# Patient Record
Sex: Female | Born: 1960 | Race: White | Hispanic: No | Smoking: Current every day smoker
Health system: Southern US, Community
[De-identification: ages and names within clinical notes are randomized; demographics above are authoritative.]

## PROBLEM LIST (undated history)

## (undated) DIAGNOSIS — F419 Anxiety disorder, unspecified: Secondary | ICD-10-CM

## (undated) DIAGNOSIS — E7800 Pure hypercholesterolemia, unspecified: Secondary | ICD-10-CM

## (undated) DIAGNOSIS — R112 Nausea with vomiting, unspecified: Secondary | ICD-10-CM

## (undated) DIAGNOSIS — S2249XA Multiple fractures of ribs, unspecified side, initial encounter for closed fracture: Secondary | ICD-10-CM

## (undated) DIAGNOSIS — Q8901 Asplenia (congenital): Secondary | ICD-10-CM

## (undated) DIAGNOSIS — Z972 Presence of dental prosthetic device (complete) (partial): Secondary | ICD-10-CM

## (undated) DIAGNOSIS — M51369 Other intervertebral disc degeneration, lumbar region without mention of lumbar back pain or lower extremity pain: Secondary | ICD-10-CM

## (undated) DIAGNOSIS — Z9889 Other specified postprocedural states: Secondary | ICD-10-CM

## (undated) DIAGNOSIS — M1990 Unspecified osteoarthritis, unspecified site: Secondary | ICD-10-CM

## (undated) DIAGNOSIS — N3281 Overactive bladder: Secondary | ICD-10-CM

## (undated) DIAGNOSIS — E785 Hyperlipidemia, unspecified: Secondary | ICD-10-CM

## (undated) DIAGNOSIS — G4700 Insomnia, unspecified: Secondary | ICD-10-CM

## (undated) DIAGNOSIS — F32A Depression, unspecified: Secondary | ICD-10-CM

## (undated) DIAGNOSIS — K219 Gastro-esophageal reflux disease without esophagitis: Secondary | ICD-10-CM

## (undated) DIAGNOSIS — I639 Cerebral infarction, unspecified: Secondary | ICD-10-CM

## (undated) DIAGNOSIS — S2239XA Fracture of one rib, unspecified side, initial encounter for closed fracture: Secondary | ICD-10-CM

## (undated) HISTORY — PX: COLONOSCOPY: SHX174

## (undated) HISTORY — PX: ABDOMINAL HYSTERECTOMY: SHX81

## (undated) HISTORY — PX: OTHER SURGICAL HISTORY: SHX169

## (undated) HISTORY — PX: CHOLECYSTECTOMY: SHX55

## (undated) HISTORY — PX: ANKLE SURGERY: SHX546

## (undated) HISTORY — PX: TONSILLECTOMY: SUR1361

---

## 2004-03-29 ENCOUNTER — Ambulatory Visit: Payer: Self-pay | Admitting: General Practice

## 2006-09-25 ENCOUNTER — Ambulatory Visit: Payer: Self-pay | Admitting: Unknown Physician Specialty

## 2008-04-14 ENCOUNTER — Ambulatory Visit: Payer: Self-pay | Admitting: Internal Medicine

## 2008-07-22 ENCOUNTER — Ambulatory Visit: Payer: Self-pay | Admitting: Internal Medicine

## 2008-10-28 ENCOUNTER — Ambulatory Visit: Payer: Self-pay | Admitting: Otolaryngology

## 2008-11-03 ENCOUNTER — Ambulatory Visit: Payer: Self-pay | Admitting: Otolaryngology

## 2008-11-13 ENCOUNTER — Emergency Department: Payer: Self-pay | Admitting: Emergency Medicine

## 2008-11-18 ENCOUNTER — Ambulatory Visit: Payer: Self-pay | Admitting: Otolaryngology

## 2008-11-24 ENCOUNTER — Observation Stay: Payer: Self-pay | Admitting: Surgery

## 2008-11-24 ENCOUNTER — Emergency Department: Payer: Self-pay | Admitting: Emergency Medicine

## 2009-04-20 ENCOUNTER — Inpatient Hospital Stay: Payer: Self-pay | Admitting: General Practice

## 2009-07-25 ENCOUNTER — Emergency Department: Payer: Self-pay | Admitting: Emergency Medicine

## 2009-11-02 ENCOUNTER — Ambulatory Visit: Payer: Self-pay | Admitting: Otolaryngology

## 2010-01-04 ENCOUNTER — Ambulatory Visit: Payer: Self-pay | Admitting: Unknown Physician Specialty

## 2010-01-06 LAB — PATHOLOGY REPORT

## 2011-05-15 ENCOUNTER — Emergency Department: Payer: Self-pay | Admitting: *Deleted

## 2011-11-08 ENCOUNTER — Ambulatory Visit: Payer: Self-pay | Admitting: Family Medicine

## 2011-11-22 ENCOUNTER — Ambulatory Visit: Payer: Self-pay | Admitting: Family Medicine

## 2011-12-23 ENCOUNTER — Ambulatory Visit: Payer: Self-pay | Admitting: General Practice

## 2012-10-26 ENCOUNTER — Ambulatory Visit: Payer: Self-pay | Admitting: Orthopedic Surgery

## 2013-04-10 DIAGNOSIS — F411 Generalized anxiety disorder: Secondary | ICD-10-CM | POA: Insufficient documentation

## 2014-02-17 LAB — URINALYSIS, COMPLETE
Bacteria: NONE SEEN
Bilirubin,UR: NEGATIVE
Blood: NEGATIVE
Glucose,UR: NEGATIVE mg/dL (ref 0–75)
KETONE: NEGATIVE
Leukocyte Esterase: NEGATIVE
NITRITE: NEGATIVE
Ph: 7 (ref 4.5–8.0)
Protein: NEGATIVE
RBC,UR: NONE SEEN /HPF (ref 0–5)
SPECIFIC GRAVITY: 1.003 (ref 1.003–1.030)
Squamous Epithelial: <1
WBC UR: <1 /HPF (ref 0–5)

## 2014-02-17 LAB — CBC
HCT: 39.1 % (ref 35.0–47.0)
HGB: 13 g/dL (ref 12.0–16.0)
MCH: 32.4 pg (ref 26.0–34.0)
MCHC: 33.3 g/dL (ref 32.0–36.0)
MCV: 97 fL (ref 80–100)
Platelet: 236 10*3/uL (ref 150–440)
RBC: 4.02 10*6/uL (ref 3.80–5.20)
RDW: 13.8 % (ref 11.5–14.5)
WBC: 8.9 10*3/uL (ref 3.6–11.0)

## 2014-02-17 LAB — COMPREHENSIVE METABOLIC PANEL
Albumin: 3.6 g/dL (ref 3.4–5.0)
Alkaline Phosphatase: 67 U/L
Anion Gap: 1 — ABNORMAL LOW (ref 7–16)
BUN: 4 mg/dL — ABNORMAL LOW (ref 7–18)
Bilirubin,Total: 0.2 mg/dL (ref 0.2–1.0)
CALCIUM: 9.2 mg/dL (ref 8.5–10.1)
Chloride: 105 mmol/L (ref 98–107)
Co2: 32 mmol/L (ref 21–32)
Creatinine: 0.7 mg/dL (ref 0.60–1.30)
EGFR (African American): >60
Glucose: 93 mg/dL (ref 65–99)
OSMOLALITY: 272 (ref 275–301)
Potassium: 3.9 mmol/L (ref 3.5–5.1)
SGOT(AST): 12 U/L — ABNORMAL LOW (ref 15–37)
SGPT (ALT): 16 U/L
Sodium: 138 mmol/L (ref 136–145)
Total Protein: 6.6 g/dL (ref 6.4–8.2)

## 2014-02-17 LAB — CK TOTAL AND CKMB (NOT AT ARMC)
CK, TOTAL: 63 U/L
CK-MB: 0.5 ng/mL (ref 0.5–3.6)

## 2014-02-17 LAB — TROPONIN I

## 2014-02-17 LAB — LIPASE, BLOOD: Lipase: 85 U/L (ref 73–393)

## 2014-02-18 ENCOUNTER — Inpatient Hospital Stay: Payer: Self-pay | Admitting: Internal Medicine

## 2014-02-18 LAB — LIPID PANEL
Cholesterol: 199 mg/dL (ref 0–200)
HDL: 26 mg/dL — AB (ref 40–60)
LDL CHOLESTEROL, CALC: 109 mg/dL — AB (ref 0–100)
Triglycerides: 319 mg/dL — ABNORMAL HIGH (ref 0–200)
VLDL CHOLESTEROL, CALC: 64 mg/dL — AB (ref 5–40)

## 2014-02-26 DIAGNOSIS — E781 Pure hyperglyceridemia: Secondary | ICD-10-CM | POA: Insufficient documentation

## 2014-03-31 ENCOUNTER — Ambulatory Visit: Payer: Self-pay | Admitting: Family Medicine

## 2014-09-23 NOTE — Op Note (Signed)
PATIENT NAME:  Kelli, Marks MR#:  [id number] DATE OF BIRTH:  12-01-1960  DATE OF PROCEDURE:  12/23/2011  PREOPERATIVE DIAGNOSIS: Painful retained hardware status post ORIF of left bimalleolar ankle fracture.   POSTOPERATIVE DIAGNOSIS:  Painful retained hardware status post ORIF of left bimalleolar ankle fracture.   PROCEDURE PERFORMED: Removal of an eight-hole one-third tubular plate and seven screws from the left lateral malleolus.   SURGEON: Laurice Record. Hooten, MD   ANESTHESIA: General.   ESTIMATED BLOOD LOSS: Minimal.   TOURNIQUET TIME: 24 minutes.   DRAINS: None.   INDICATIONS FOR SURGERY:  The patient is a 54 year old female who is status post ORIF of a left bimalleolar ankle fracture. The fracture healed uneventfully. The patient has had some irritation across the region of the screw heads and the plate. Screw heads are visible under the skin and easily palpable due to the patient's body habitus. After discussion of the risks and benefits of surgical intervention, the patient expressed her understanding of the risks and benefits, and agreed with plans for surgical intervention.   PROCEDURE IN DETAIL: The patient was brought into the operating room and, after adequate general anesthesia was achieved, a tourniquet was placed on the patient's left thigh. The patient's left foot and ankle were cleaned and prepped with alcohol and DuraPrep and draped in the usual sterile fashion. A "time-out" was performed as per usual protocol. The left lower extremity was exsanguinated using an Esmarch, and the tourniquet was inflated to 300 mmHg. A lateral longitudinal incision was made in line with the previous surgical scar. The incision was taken down to the plate and screws with a full-thickness flap.  A total of six 3.5-mm cortical screws and one 4-mm cancellus screw was removed without difficulty. The eight-hole one-third tubular plate was elevated off of the distal fibula. The wound was irrigated  with copious amounts of normal saline with antibiotic solution. Good consolidation of the previous fracture was noted. The subcuticular tissue was reapproximated using interrupted sutures of #2-0 Vicryl. Skin was closed with skin staples. 10 mL of 0.25% Marcaine was injected along the incision site. A sterile dressing was applied followed by application of a soft bandage. Tourniquet was deflated after a total tourniquet time of 24 minutes.   The patient tolerated the procedure well. She was transported to the recovery room in stable condition.    ____________________________ Laurice Record. Holley Bouche., MD [id number]:bjt D: 12/23/2011 12:36:12 ET T: 12/23/2011 13:00:33 ET JOB#: [id number]  cc: Jeneen Rinks P. Holley Bouche., MD, <Dictator> JAMES P Holley Bouche MD ELECTRONICALLY SIGNED 12/25/2011 8:12

## 2014-09-27 ENCOUNTER — Emergency Department
Admit: 2014-09-27 | Disposition: A | Discharge status: Home / Self Care | Payer: Self-pay | Admitting: Emergency Medicine

## 2014-09-27 NOTE — H&P (Signed)
PATIENT NAME:  Kelli Marks, Kelli Marks MR#:  [id number] DATE OF BIRTH:  06/10/1960  DATE OF ADMISSION:  02/17/2014  REFERRING PHYSICIAN: Verdia Kuba. Kerman Passey, MD   PRIMARY CARE PHYSICIAN: Francee Nodal, MD  CHIEF COMPLAINT: Numbness.  HISTORY OF PRESENT ILLNESS: A 53 year old Caucasian female without significant past medical history presenting with bilateral lower extremity paresthesias as well as numbness. She describes 1 day duration of the above symptoms and bilateral paresthesias and numbness essentially from the knees downwards with associated lightheadedness. She describes lightheadedness when going from the sitting to standing position and denies any further symptomatology. She denies any fevers, chills, chest pain, palpitations and she has not had adequate p.o. intake the last few days.   REVIEW OF SYSTEMS: CONSTITUTIONAL: Denies fevers, chills, fatigue.  EYES: Denies blurred vision, double vision or eye pain.  EARS, NOSE, THROAT: Denies tinnitus, hearing loss. RESPIRATORY: Denies cough, wheeze, shortness of breath.  CARDIOVASCULAR: Denies chest pain, palpitations, edema.  GASTROINTESTINAL: Denies nausea, vomiting, diarrhea, abdominal pain.  GENITOURINARY: Denies dysuria or hematuria.  ENDOCRINOLOGY: Denies nocturia or thyroid problems. HEMATOLOGY: Denies easy bruising, bleeding.  SKIN: Denies rash or lesion. MUSCULOSKELETAL: Denies pain in neck, back, shoulder, knees, hips or arthritis symptoms. NEUROLOGICAL: Positive for paresthesias as well as numbness of the lower extremities as described above. Denies any paralysis. Denies any dysarthria or headache.  PSYCHIATRIC: Denies anxiety or depressive symptoms. Otherwise, full review of systems performed is negative.   PAST MEDICAL HISTORY: Depression, not otherwise specified.   SOCIAL HISTORY: Positive for tobacco use as well as occasional alcohol use. Denies any drug use.   FAMILY HISTORY: Positive for coronary artery disease.    ALLERGIES: PERCOCET.   HOME MEDICATIONS: Include Klonopin 0.5 mg p.o. 3 times daily as needed for anxiety, Effexor 75 mg 3 capsules p.o. daily, oxybutynin 5 mg p.o. b.i.d.   PHYSICAL EXAMINATION:  VITAL SIGNS: Heart rate 90, respirations 16, blood pressure 142/69, saturating 99% on room air. Weight 65.8 kg, BMI 22.7.  GENERAL: Well-nourished, well-developed, Caucasian female, currently in no acute distress.  HEAD: Normocephalic, atraumatic.  EYES: Pupils equal, round, reactive to light. Extraocular muscles intact. No scleral icterus.  MOUTH: Moist mucous membranes. Dentition poor. No abscess noted.  EARS, NOSE, THROAT: Clear without exudates. No external lesions.  NECK: Supple. No thyromegaly. No nodules. No JVD.  PULMONARY: Clear to auscultation bilaterally without wheezes, rales, rhonchi. No use of accessory muscles. Good respiratory effort.  CHEST: Nontender to palpation.  CARDIOVASCULAR: S1, S2, regular rate and rhythm. No murmurs, rubs or gallops. No edema. Pedal pulses 2+ bilaterally.  GASTROINTESTINAL: Soft, nontender, nondistended, no masses, positive bowel sounds. No hepatosplenomegaly. MUSCULOSKELETAL: No swelling, clubbing, or edema. Able to move all extremities.  NEUROLOGIC: Cranial nerves II-XII intact. No gross focal neurological deficits. Sensation intact. Reflexes intact.  SKIN: No ulceration, lesions, rash, or cyanosis Skin warm, dry, turgor intact. PSYCHIATRIC: Mood and affect within normal limits. Awake, oriented x 3. Insight and judgment intact.   LABORATORY DATA: Sodium 138, potassium 3.9, chloride 105, bicarbonate 32, BUN 4, creatinine 0.7, glucose 93. LFTs within normal limits. WBC 8.9, hemoglobin 13, platelets of 236,000. Urinalysis: No evidence of infection. CT head performed revealing oval area of low-density, subcortical white matter in the right frontal lobe possibly representing subacute infarct or ischemic change. CT of the cervical spine performed revealing  cervical spine degenerative changes, however, no acute findings.   ASSESSMENT AND PLAN: A 54 year old Caucasian female without significant past medical history presented with bilateral lower extremity paresthesias and numbness,  found to have right frontal cerebrovascular accident on CT head. 1.  Right frontal cerebrovascular accident. Initiate aspirin and statin therapy. Place on telemetry under observational status. Neurologic checks q. 4 hours. Transthoracic echocardiogram, carotid Dopplers as well as an MRI and lipid in searching for etiologies and risk factor modification.  2.  Depression, not otherwise specified. Continue with Effexor. 3.  Venous thromboembolism prophylaxis with SCDs.  CODE STATUS: The patient is a full code.  TIME SPENT: Forty-five minutes.   ____________________________ Aaron Mose. Donjuan Robison, MD dkh:TT D: 02/17/2014 22:07:07 ET T: 02/17/2014 22:38:18 ET JOB#: [id number]  cc: Aaron Mose. Jadis Mika, MD, <Dictator> Kasiah Manka Woodfin Ganja MD ELECTRONICALLY SIGNED 02/18/2014 20:27

## 2014-09-27 NOTE — Discharge Summary (Signed)
PATIENT NAME:  Kelli Marks, Kelli Marks MR#:  [id number] DATE OF BIRTH:  Dec 31, 1960  DATE OF ADMISSION:  02/18/2014 DATE OF DISCHARGE:  02/18/2014  For a detailed note, please take a look at the history and physical done on admission by Dr. Valentino Nose.   DISCHARGE DIAGNOSES AS FOLLOWS: Bilateral lower extremity paresthesias, etiology unclear, but cerebrovascular accident ruled out.  Anxiety. Hyperlipidemia. Urinary incontinence.   DIET:  The patient is being discharged in a low-sodium, low-fat diet.   ACTIVITY: As tolerated.   FOLLOWUP:  Dr. Mila Palmer in the next 1 to 2 weeks.   DISCHARGE MEDICATIONS:  Are oxybutynin 5 mg b.i.d., Klonopin 0.5 mg t.i.d. as needed, Effexor 75 mg 3 caps daily, aspirin 81 mg daily, and simvastatin 10 mg at bedtime.   PERTINENT STUDIES DONE DURING THE HOSPITAL COURSE: Are as follows: A CT scan of the head done without contrast on admission showing old area of low density in the subcortical white matter of the right frontal lobe. This could represent either a subacute infarct or area of chronic ischemic change. A CT of the cervical spine showing no evidence of any acute fracture or a bony abnormality.   An ultrasound of the carotids showing no evidence of any hemodynamically significant carotid artery stenosis. An MRI of the brain done without contrast showing no acute infarct, no intracranial hemorrhage, mild white matter changes consistent with small vessel disease, remote infarct in the right frontal operculum region. No intracranial masses. No hydrocephalus.   HOSPITAL COURSE: This is a 54 year old female who presents to the hospital with a headache and bilateral paresthesias.  1.  Bilateral lower extremity paresthesias and a headache. Initially, the patient's symptoms were thought to be related to a stroke, but that has now been ruled out. The patient underwent an extensive work-up including a CT head, MRI of the brain, carotid duplex, all of which are currently  negative for any acute abnormality. The patient still continues to have some paresthesias. This possibly could be related to some neuropathy or some underlying neurologic cause. She can be discharged home and then get an EMG study or see a neurologist as an outpatient in the next few weeks.  2.  Hyperlipidemia. The patient did have a lipid panel checked while in the hospital.  Her total cholesterol was close to 200.  Her LDL 109. I did discharge her on a low-dose statin.  3.  Anxiety. The patient was maintained on her Klonopin and she will resume that.  4.  Urinary incontinence. The patient was maintained on oxybutynin and she will also resume that upon discharge.   CODE STATUS:  The patient is a full code.   TIME SPENT ON DISCHARGE: Is 35 minutes   ____________________________ Belia Heman. Verdell Carmine, MD vjs:lr D: 02/18/2014 14:50:00 ET T: 02/18/2014 15:01:38 ET JOB#: [id number]  cc: Orion Crook, MD Belia Heman. Verdell Carmine, MD, <Dictator>    Henreitta Leber MD ELECTRONICALLY SIGNED 02/24/2014 9:43

## 2014-10-29 ENCOUNTER — Emergency Department
Admission: EM | Admit: 2014-10-29 | Discharge: 2014-10-29 | Disposition: A | Discharge status: Home / Self Care | Payer: 59 | Attending: Emergency Medicine | Admitting: Emergency Medicine

## 2014-10-29 ENCOUNTER — Emergency Department: Payer: 59

## 2014-10-29 ENCOUNTER — Encounter: Payer: Self-pay | Admitting: Urgent Care

## 2014-10-29 DIAGNOSIS — R110 Nausea: Secondary | ICD-10-CM | POA: Diagnosis not present

## 2014-10-29 DIAGNOSIS — R42 Dizziness and giddiness: Secondary | ICD-10-CM | POA: Insufficient documentation

## 2014-10-29 DIAGNOSIS — R0602 Shortness of breath: Secondary | ICD-10-CM | POA: Insufficient documentation

## 2014-10-29 DIAGNOSIS — Y9389 Activity, other specified: Secondary | ICD-10-CM | POA: Insufficient documentation

## 2014-10-29 DIAGNOSIS — Z7982 Long term (current) use of aspirin: Secondary | ICD-10-CM | POA: Diagnosis not present

## 2014-10-29 DIAGNOSIS — S2231XA Fracture of one rib, right side, initial encounter for closed fracture: Secondary | ICD-10-CM

## 2014-10-29 DIAGNOSIS — W19XXXA Unspecified fall, initial encounter: Secondary | ICD-10-CM

## 2014-10-29 DIAGNOSIS — Z79899 Other long term (current) drug therapy: Secondary | ICD-10-CM | POA: Insufficient documentation

## 2014-10-29 DIAGNOSIS — S2242XA Multiple fractures of ribs, left side, initial encounter for closed fracture: Secondary | ICD-10-CM | POA: Diagnosis not present

## 2014-10-29 DIAGNOSIS — Z720 Tobacco use: Secondary | ICD-10-CM | POA: Diagnosis not present

## 2014-10-29 DIAGNOSIS — Y9289 Other specified places as the place of occurrence of the external cause: Secondary | ICD-10-CM | POA: Insufficient documentation

## 2014-10-29 DIAGNOSIS — W06XXXA Fall from bed, initial encounter: Secondary | ICD-10-CM | POA: Diagnosis not present

## 2014-10-29 DIAGNOSIS — R05 Cough: Secondary | ICD-10-CM | POA: Diagnosis not present

## 2014-10-29 DIAGNOSIS — Y998 Other external cause status: Secondary | ICD-10-CM | POA: Diagnosis not present

## 2014-10-29 DIAGNOSIS — S299XXA Unspecified injury of thorax, initial encounter: Secondary | ICD-10-CM | POA: Diagnosis present

## 2014-10-29 HISTORY — DX: Pure hypercholesterolemia, unspecified: E78.00

## 2014-10-29 MED ORDER — OXYCODONE-ACETAMINOPHEN 5-325 MG PO TABS: 1.0000 | ORAL_TABLET | ORAL | Status: DC | PRN

## 2014-10-29 MED ORDER — KETOROLAC TROMETHAMINE 30 MG/ML IJ SOLN
30.0000 mg | Freq: Once | INTRAMUSCULAR | Status: AC
  Administered 2014-10-29: 30 mg via INTRAMUSCULAR

## 2014-10-29 MED ORDER — KETOROLAC TROMETHAMINE 30 MG/ML IJ SOLN
INTRAMUSCULAR | Status: AC
  Administered 2014-10-29: 30 mg via INTRAMUSCULAR
  Filled 2014-10-29: qty 1

## 2014-10-29 MED ORDER — ONDANSETRON HCL 4 MG PO TABS: 4.0000 mg | ORAL_TABLET | Freq: Every day | ORAL | Status: DC | PRN

## 2014-10-29 MED ORDER — MORPHINE SULFATE 4 MG/ML IJ SOLN
4.0000 mg | Freq: Once | INTRAMUSCULAR | Status: AC
  Administered 2014-10-29: 4 mg via INTRAMUSCULAR

## 2014-10-29 MED ORDER — MORPHINE SULFATE 4 MG/ML IJ SOLN
INTRAMUSCULAR | Status: AC
  Administered 2014-10-29: 4 mg via INTRAMUSCULAR
  Filled 2014-10-29: qty 1

## 2014-10-29 MED ORDER — ONDANSETRON 4 MG PO TBDP
4.0000 mg | ORAL_TABLET | Freq: Once | ORAL | Status: AC
  Administered 2014-10-29: 4 mg via ORAL

## 2014-10-29 MED ORDER — ONDANSETRON 4 MG PO TBDP
ORAL_TABLET | ORAL | Status: AC
  Administered 2014-10-29: 4 mg via ORAL
  Filled 2014-10-29: qty 1

## 2014-10-29 NOTE — ED Notes (Signed)
Pt informed to return if life threatening symptoms occur.   

## 2014-10-29 NOTE — Discharge Instructions (Signed)
Rib Fracture °A rib fracture is a break or crack in one of the bones of the ribs. The ribs are a group of long, curved bones that wrap around your chest and attach to your spine. They protect your lungs and other organs in the chest cavity. A broken or cracked rib is often painful, but most do not cause other problems. Most rib fractures heal on their own over time. However, rib fractures can be more serious if multiple ribs are broken or if broken ribs move out of place and push against other structures. °CAUSES  °· A direct blow to the chest. For example, this could happen during contact sports, a car accident, or a fall against a hard object. °· Repetitive movements with high force, such as pitching a baseball or having severe coughing spells. °SYMPTOMS  °· Pain when you breathe in or cough. °· Pain when someone presses on the injured area. °DIAGNOSIS  °Your caregiver will perform a physical exam. Various imaging tests may be ordered to confirm the diagnosis and to look for related injuries. These tests may include a chest X-ray, computed tomography (CT), magnetic resonance imaging (MRI), or a bone scan. °TREATMENT  °Rib fractures usually heal on their own in 1-3 months. The longer healing period is often associated with a continued cough or other aggravating activities. During the healing period, pain control is very important. Medication is usually given to control pain. Hospitalization or surgery may be needed for more severe injuries, such as those in which multiple ribs are broken or the ribs have moved out of place.  °HOME CARE INSTRUCTIONS  °· Avoid strenuous activity and any activities or movements that cause pain. Be careful during activities and avoid bumping the injured rib. °· Gradually increase activity as directed by your caregiver. °· Only take over-the-counter or prescription medications as directed by your caregiver. Do not take other medications without asking your caregiver first. °· Apply ice  to the injured area for the first 1-2 days after you have been treated or as directed by your caregiver. Applying ice helps to reduce inflammation and pain. °¨ Put ice in a plastic bag. °¨ Place a towel between your skin and the bag.   °¨ Leave the ice on for 15-20 minutes at a time, every 2 hours while you are awake. °· Perform deep breathing as directed by your caregiver. This will help prevent pneumonia, which is a common complication of a broken rib. Your caregiver may instruct you to: °¨ Take deep breaths several times a day. °¨ Try to cough several times a day, holding a pillow against the injured area. °¨ Use a device called an incentive spirometer to practice deep breathing several times a day. °· Drink enough fluids to keep your urine clear or pale yellow. This will help you avoid constipation.   °· Do not wear a rib belt or binder. These restrict breathing, which can lead to pneumonia.   °SEEK IMMEDIATE MEDICAL CARE IF:  °· You have a fever.   °· You have difficulty breathing or shortness of breath.   °· You develop a continual cough, or you cough up thick or bloody sputum. °· You feel sick to your stomach (nausea), throw up (vomit), or have abdominal pain.   °· You have worsening pain not controlled with medications.   °MAKE SURE YOU: °· Understand these instructions. °· Will watch your condition. °· Will get help right away if you are not doing well or get worse. °Document Released: 05/23/2005 Document Revised: 01/23/2013 Document Reviewed:   07/25/2012 [employer] Patient Information 2015 [place], [place]. This information is not intended to replace advice given to you by your health care provider. Make sure you discuss any questions you have with your health care provider.  Fall Prevention and Home Safety Falls cause injuries and can affect all age groups. It is possible to prevent falls.  HOW TO PREVENT FALLS  Wear shoes with rubber soles that do not have an opening for your toes.  Keep the inside  and outside of your house well lit.  Use night lights throughout your home.  Remove clutter from floors.  Clean up floor spills.  Remove throw rugs or fasten them to the floor with carpet tape.  Do not place electrical cords across pathways.  Put grab bars by your tub, shower, and toilet. Do not use towel bars as grab bars.  Put handrails on both sides of the stairway. Fix loose handrails.  Do not climb on stools or stepladders, if possible.  Do not wax your floors.  Repair uneven or unsafe sidewalks, walkways, or stairs.  Keep items you use a lot within reach.  Be aware of pets.  Keep emergency numbers next to the telephone.  Put smoke detectors in your home and near bedrooms. Ask your doctor what other things you can do to prevent falls. Document Released: 03/19/2009 Document Revised: 11/22/2011 Document Reviewed: 08/23/2011 [facility] Patient Information 2015 [place], [place]. This information is not intended to replace advice given to you by your health care provider. Make sure you discuss any questions you have with your health care provider.

## 2014-10-29 NOTE — ED Notes (Signed)
Patient presents with c/o LEFT lateral chest wall pain s/p fall from her bed. (+) SOB reported.

## 2014-10-29 NOTE — ED Notes (Signed)
Pt presents to ED after fall from bed between 330am and 4am this morning. Pt states she rolled out of bed onto the floor; pt denies any pain to extremities' pain is localized to ribs and neck.

## 2014-10-29 NOTE — ED Provider Notes (Signed)
[facility] Emergency Department Provider Note  ____________________________________________  Time seen: Approximately 563 AM  I have reviewed the triage vital signs and the nursing notes.   HISTORY  Chief Complaint Fall and Chest Pain   HPI GIANELLE MCCAUL is a 54 y.o. female who comes in tonight after rolling out of bed. The patient rolled approximately 3 feet off of her bed onto a hardwood floor. The patient reports that she landed on her back and felt as though she couldn't breathe initially. She denies hitting her head but has some 10 out of 10 left-sided chest pain. The patient reports that she feels as though she may have broken some ribs. The patient reports that she becomes nauseous and hurts when she moves.The patient came in immediately for further evaluation.   Past Medical History  Diagnosis Date  . Hypercholesteremia     There are no active problems to display for this patient.   Past Surgical History  Procedure Laterality Date  . Abdominal hysterectomy    . Ankle surgery Left   . Cholecystectomy    . Tonsillectomy      Current Outpatient Rx  Name  Route  Sig  Dispense  Refill  . aspirin EC 81 MG tablet   Oral   Take 81 mg by mouth daily.         Marland Kitchen oxybutynin (DITROPAN) 5 MG tablet   Oral   Take 5 mg by mouth 2 (two) times daily.         . simvastatin (ZOCOR) 10 MG tablet   Oral   Take 10 mg by mouth at bedtime.          Marland Kitchen venlafaxine XR (EFFEXOR-XR) 150 MG 24 hr capsule   Oral   Take 150 mg by mouth daily.          Marland Kitchen aspirin 81 MG tablet   Oral   Take 81 mg by mouth daily.         Marland Kitchen oxyCODONE-acetaminophen (ROXICET) 5-325 MG per tablet   Oral   Take 1 tablet by mouth every 4 (four) hours as needed for severe pain.   20 tablet   0     Allergies Review of patient's allergies indicates no known allergies.  No family history on file.  Social History History  Substance Use Topics  . Smoking status:  Current Every Day Smoker  . Smokeless tobacco: Not on file  . Alcohol Use: No    Review of Systems Constitutional: No fever/chills Eyes: No visual changes. ENT: No sore throat. Cardiovascular:  chest pain. Respiratory:  shortness of breath and cough Gastrointestinal: Nausea Genitourinary: Negative for dysuria. Musculoskeletal:  back pain. Skin: Negative for rash. Neurological: Lightheadedness  10-point ROS otherwise negative.  ____________________________________________   PHYSICAL EXAM:  VITAL SIGNS: ED Triage Vitals  Enc Vitals Group     BP 10/29/14 0517 111/68 mmHg     Pulse Rate 10/29/14 0517 80     Resp 10/29/14 0517 18     Temp 10/29/14 0517 98.9 F (37.2 C)     Temp Source 10/29/14 0517 Oral     SpO2 10/29/14 0517 99 %     Weight 10/29/14 0517 145 lb (65.772 kg)     Height 10/29/14 0517 5\' 7"  (1.702 m)     Head Cir --      Peak Flow --      Pain Score 10/29/14 0518 10     Pain Loc --  Pain Edu? --      Excl. in [place]? --     Constitutional: Alert and oriented. Well appearing and in moderate to severe distress. Eyes: Conjunctivae are normal. PERRL. EOMI. Head: Atraumatic. Nose: No congestion/rhinnorhea. Mouth/Throat: Mucous membranes are moist.  Oropharynx non-erythematous. Cardiovascular: Normal rate, regular rhythm. Grossly normal heart sounds.  Good peripheral circulation. Respiratory: Normal respiratory effort.  No retractions. Lungs CTAB left chest wall tenderness to palpation Gastrointestinal: Soft and nontender. No distention. Positive bowel sounds Genitourinary: Deferred Musculoskeletal: No lower extremity tenderness nor edema.   Neurologic:  Normal speech and language. No gross focal neurologic deficits are appreciated.  Skin:  Skin is warm, dry and intact. No rash noted. Psychiatric: Mood and affect are normal. Speech and behavior are normal.  ____________________________________________   LABS (all labs ordered are listed, but only  abnormal results are displayed)  Labs Reviewed - No data to display ____________________________________________  EKG  None ____________________________________________  RADIOLOGY  Chest x-ray: Nondisplaced left posterior seventh and eighth rib fractures, no evidence of intrathoracic injury ____________________________________________   PROCEDURES  Procedure(s) performed: None  Critical Care performed: No  ____________________________________________   INITIAL IMPRESSION / ASSESSMENT AND PLAN / ED COURSE  Pertinent labs & imaging results that were available during my care of the patient were reviewed by me and considered in my medical decision making (see chart for details).  The patient is a 54 year old female who rolled out of bed and developed some severe left-sided chest pain after she fell onto the hardwood floors. The patient appears to have some left rib fractures posteriorly causing her pain. I will give the patient a dose of morphine IM Zofran orally as well as Toradol. I will reassess the patient when she's receive her medications.  The patient reports that her pain is improved although it still bad with moving. I will discharge the patient home with an incentive spirometer to help her maintain her deep inspirations. I will also have the patient follow-up with her primary care physician. ____________________________________________   FINAL CLINICAL IMPRESSION(S) / ED DIAGNOSES  Final diagnoses:  Closed rib fracture, right, initial encounter  Fall, initial encounter      Loney Hering, MD 10/29/14 [phone]

## 2014-10-29 NOTE — ED Notes (Signed)
Pt able to use incentive spirometry. Pt education given at this time.Pt with satisfactory example.

## 2014-10-31 ENCOUNTER — Emergency Department: Payer: 59

## 2014-10-31 ENCOUNTER — Encounter: Payer: Self-pay | Admitting: Radiology

## 2014-10-31 ENCOUNTER — Inpatient Hospital Stay
Admission: EM | Admit: 2014-10-31 | Discharge: 2014-11-09 | DRG: 799 | Disposition: A | Discharge status: Home / Self Care | Payer: 59 | Attending: Surgery | Admitting: Surgery

## 2014-10-31 ENCOUNTER — Inpatient Hospital Stay: Payer: 59

## 2014-10-31 DIAGNOSIS — D62 Acute posthemorrhagic anemia: Secondary | ICD-10-CM | POA: Diagnosis present

## 2014-10-31 DIAGNOSIS — J9600 Acute respiratory failure, unspecified whether with hypoxia or hypercapnia: Secondary | ICD-10-CM | POA: Diagnosis not present

## 2014-10-31 DIAGNOSIS — E871 Hypo-osmolality and hyponatremia: Secondary | ICD-10-CM | POA: Diagnosis present

## 2014-10-31 DIAGNOSIS — J9601 Acute respiratory failure with hypoxia: Secondary | ICD-10-CM | POA: Diagnosis present

## 2014-10-31 DIAGNOSIS — J9811 Atelectasis: Secondary | ICD-10-CM | POA: Diagnosis present

## 2014-10-31 DIAGNOSIS — G8929 Other chronic pain: Secondary | ICD-10-CM | POA: Diagnosis present

## 2014-10-31 DIAGNOSIS — F329 Major depressive disorder, single episode, unspecified: Secondary | ICD-10-CM | POA: Diagnosis present

## 2014-10-31 DIAGNOSIS — D72829 Elevated white blood cell count, unspecified: Secondary | ICD-10-CM | POA: Diagnosis present

## 2014-10-31 DIAGNOSIS — E780 Pure hypercholesterolemia: Secondary | ICD-10-CM | POA: Diagnosis present

## 2014-10-31 DIAGNOSIS — Z7982 Long term (current) use of aspirin: Secondary | ICD-10-CM

## 2014-10-31 DIAGNOSIS — E785 Hyperlipidemia, unspecified: Secondary | ICD-10-CM | POA: Diagnosis present

## 2014-10-31 DIAGNOSIS — R32 Unspecified urinary incontinence: Secondary | ICD-10-CM | POA: Diagnosis present

## 2014-10-31 DIAGNOSIS — K5900 Constipation, unspecified: Secondary | ICD-10-CM | POA: Diagnosis present

## 2014-10-31 DIAGNOSIS — S2242XA Multiple fractures of ribs, left side, initial encounter for closed fracture: Secondary | ICD-10-CM | POA: Diagnosis present

## 2014-10-31 DIAGNOSIS — J449 Chronic obstructive pulmonary disease, unspecified: Secondary | ICD-10-CM | POA: Diagnosis present

## 2014-10-31 DIAGNOSIS — J9690 Respiratory failure, unspecified, unspecified whether with hypoxia or hypercapnia: Secondary | ICD-10-CM

## 2014-10-31 DIAGNOSIS — W06XXXA Fall from bed, initial encounter: Secondary | ICD-10-CM | POA: Diagnosis present

## 2014-10-31 DIAGNOSIS — E876 Hypokalemia: Secondary | ICD-10-CM | POA: Diagnosis not present

## 2014-10-31 DIAGNOSIS — Z79899 Other long term (current) drug therapy: Secondary | ICD-10-CM

## 2014-10-31 DIAGNOSIS — E860 Dehydration: Secondary | ICD-10-CM | POA: Diagnosis present

## 2014-10-31 DIAGNOSIS — D649 Anemia, unspecified: Secondary | ICD-10-CM | POA: Diagnosis present

## 2014-10-31 DIAGNOSIS — Y92013 Bedroom of single-family (private) house as the place of occurrence of the external cause: Secondary | ICD-10-CM

## 2014-10-31 DIAGNOSIS — K661 Hemoperitoneum: Secondary | ICD-10-CM | POA: Diagnosis present

## 2014-10-31 DIAGNOSIS — S36039A Unspecified laceration of spleen, initial encounter: Secondary | ICD-10-CM | POA: Diagnosis present

## 2014-10-31 DIAGNOSIS — Z4659 Encounter for fitting and adjustment of other gastrointestinal appliance and device: Secondary | ICD-10-CM

## 2014-10-31 DIAGNOSIS — F17210 Nicotine dependence, cigarettes, uncomplicated: Secondary | ICD-10-CM | POA: Diagnosis present

## 2014-10-31 HISTORY — DX: Fracture of one rib, unspecified side, initial encounter for closed fracture: S22.39XA

## 2014-10-31 HISTORY — DX: Hyperlipidemia, unspecified: E78.5

## 2014-10-31 HISTORY — DX: Multiple fractures of ribs, unspecified side, initial encounter for closed fracture: S22.49XA

## 2014-10-31 LAB — CBC WITH DIFFERENTIAL/PLATELET
Basophils Absolute: 0.1 10*3/uL (ref 0–0.1)
Basophils Relative: 1 %
EOS ABS: 0 10*3/uL (ref 0–0.7)
Eosinophils Relative: 0 %
HEMATOCRIT: 21.9 % — AB (ref 35.0–47.0)
Hemoglobin: 7.5 g/dL — ABNORMAL LOW (ref 12.0–16.0)
LYMPHS ABS: 1.6 10*3/uL (ref 1.0–3.6)
Lymphocytes Relative: 12 %
MCH: 31.9 pg (ref 26.0–34.0)
MCHC: 34.5 g/dL (ref 32.0–36.0)
MCV: 92.5 fL (ref 80.0–100.0)
Monocytes Absolute: 1.2 10*3/uL — ABNORMAL HIGH (ref 0.2–0.9)
Monocytes Relative: 9 %
Neutro Abs: 10.6 10*3/uL — ABNORMAL HIGH (ref 1.4–6.5)
Neutrophils Relative %: 78 %
Platelets: 189 10*3/uL (ref 150–440)
RBC: 2.36 MIL/uL — AB (ref 3.80–5.20)
RDW: 13.3 % (ref 11.5–14.5)
WBC: 13.5 10*3/uL — ABNORMAL HIGH (ref 3.6–11.0)

## 2014-10-31 LAB — GLUCOSE, CAPILLARY: Glucose-Capillary: 140 mg/dL — ABNORMAL HIGH (ref 65–99)

## 2014-10-31 LAB — COMPREHENSIVE METABOLIC PANEL
ALBUMIN: 3.7 g/dL (ref 3.5–5.0)
ALK PHOS: 45 U/L (ref 38–126)
ALT: 12 U/L — AB (ref 14–54)
ANION GAP: 8 (ref 5–15)
AST: 19 U/L (ref 15–41)
BUN: 22 mg/dL — ABNORMAL HIGH (ref 6–20)
CO2: 27 mmol/L (ref 22–32)
Calcium: 8.6 mg/dL — ABNORMAL LOW (ref 8.9–10.3)
Chloride: 93 mmol/L — ABNORMAL LOW (ref 101–111)
Creatinine, Ser: 0.9 mg/dL (ref 0.44–1.00)
GFR calc Af Amer: >60 mL/min (ref >60)
GFR calc non Af Amer: >60 mL/min (ref >60)
Glucose, Bld: 113 mg/dL — ABNORMAL HIGH (ref 65–99)
Potassium: 4 mmol/L (ref 3.5–5.1)
Sodium: 128 mmol/L — ABNORMAL LOW (ref 135–145)
Total Bilirubin: 0.2 mg/dL — ABNORMAL LOW (ref 0.3–1.2)
Total Protein: 6.3 g/dL — ABNORMAL LOW (ref 6.5–8.1)

## 2014-10-31 LAB — ABO/RH: ABO/RH(D): O NEG

## 2014-10-31 LAB — TROPONIN I: Troponin I: <0.03 ng/mL (ref <0.031)

## 2014-10-31 MED ORDER — SODIUM CHLORIDE 0.9 % IV BOLUS (SEPSIS)
1000.0000 mL | Freq: Once | INTRAVENOUS | Status: AC
  Administered 2014-10-31: 1000 mL via INTRAVENOUS

## 2014-10-31 MED ORDER — IPRATROPIUM-ALBUTEROL 0.5-2.5 (3) MG/3ML IN SOLN
3.0000 mL | RESPIRATORY_TRACT | Status: DC
  Administered 2014-10-31 – 2014-11-01 (×7): 3 mL via RESPIRATORY_TRACT
  Filled 2014-10-31 (×7): qty 3

## 2014-10-31 MED ORDER — NICOTINE 14 MG/24HR TD PT24
14.0000 mg | MEDICATED_PATCH | Freq: Every day | TRANSDERMAL | Status: DC
  Administered 2014-10-31 – 2014-11-03 (×4): 14 mg via TRANSDERMAL
  Filled 2014-10-31 (×5): qty 1

## 2014-10-31 MED ORDER — HYDROMORPHONE HCL 1 MG/ML IJ SOLN
INTRAMUSCULAR | Status: AC
  Administered 2014-10-31: 1 mg via INTRAVENOUS
  Filled 2014-10-31: qty 1

## 2014-10-31 MED ORDER — IPRATROPIUM-ALBUTEROL 0.5-2.5 (3) MG/3ML IN SOLN
RESPIRATORY_TRACT | Status: AC
  Administered 2014-10-31: 3 mL via RESPIRATORY_TRACT
  Filled 2014-10-31: qty 3

## 2014-10-31 MED ORDER — ONDANSETRON HCL 4 MG/2ML IJ SOLN
INTRAMUSCULAR | Status: AC
  Administered 2014-10-31: 4 mg via INTRAVENOUS
  Filled 2014-10-31: qty 2

## 2014-10-31 MED ORDER — SODIUM CHLORIDE 0.9 % IV SOLN
10.0000 mL/h | Freq: Once | INTRAVENOUS | Status: AC
  Administered 2014-10-31: 10 mL/h via INTRAVENOUS

## 2014-10-31 MED ORDER — IOHEXOL 300 MG/ML  SOLN
75.0000 mL | Freq: Once | INTRAMUSCULAR | Status: AC | PRN
  Administered 2014-10-31: 75 mL via INTRAVENOUS

## 2014-10-31 MED ORDER — HYDROMORPHONE HCL 1 MG/ML IJ SOLN
0.5000 mg | INTRAMUSCULAR | Status: DC | PRN
  Administered 2014-10-31: 0.5 mg via INTRAVENOUS
  Filled 2014-10-31: qty 1

## 2014-10-31 MED ORDER — IOHEXOL 300 MG/ML  SOLN
75.0000 mL | Freq: Once | INTRAMUSCULAR | Status: AC | PRN
Start: 2014-10-31 — End: 2014-10-31
  Administered 2014-10-31: 75 mL via INTRAVENOUS

## 2014-10-31 MED ORDER — IPRATROPIUM-ALBUTEROL 0.5-2.5 (3) MG/3ML IN SOLN
3.0000 mL | Freq: Once | RESPIRATORY_TRACT | Status: AC
  Administered 2014-10-31: 3 mL via RESPIRATORY_TRACT

## 2014-10-31 MED ORDER — ONDANSETRON HCL 4 MG/2ML IJ SOLN
4.0000 mg | Freq: Once | INTRAMUSCULAR | Status: AC
  Administered 2014-10-31: 4 mg via INTRAVENOUS

## 2014-10-31 MED ORDER — HYDROMORPHONE HCL 1 MG/ML IJ SOLN
1.0000 mg | Freq: Once | INTRAMUSCULAR | Status: AC
  Administered 2014-10-31: 1 mg via INTRAVENOUS

## 2014-10-31 MED ORDER — METHYLPREDNISOLONE SODIUM SUCC 125 MG IJ SOLR
80.0000 mg | Freq: Once | INTRAMUSCULAR | Status: AC
  Administered 2014-10-31: 80 mg via INTRAVENOUS

## 2014-10-31 MED ORDER — METHYLPREDNISOLONE SODIUM SUCC 125 MG IJ SOLR
INTRAMUSCULAR | Status: AC
Start: 2014-10-31 — End: 2014-10-31
  Administered 2014-10-31: 80 mg via INTRAVENOUS
  Filled 2014-10-31: qty 2

## 2014-10-31 MED ORDER — SODIUM CHLORIDE 0.9 % IV SOLN
INTRAVENOUS | Status: DC
  Administered 2014-10-31: 19:00:00 via INTRAVENOUS

## 2014-10-31 NOTE — ED Notes (Signed)
MD at bedside. 

## 2014-10-31 NOTE — Consult Note (Signed)
[facility] [place] NAME: Kelli Marks    MR#:  [id number]  DATE OF BIRTH:  [birth year]  DATE OF ADMISSION:  10/31/2014  PRIMARY CARE PHYSICIAN: No primary care provider on file.   REQUESTING/REFERRING PHYSICIAN: Dr. Thomasene Lot  CHIEF COMPLAINT:   Chief Complaint  Patient presents with  . Rib Injury  . Fall  Shortness of breath  HISTORY OF PRESENT ILLNESS:  Kelli Marks  is a 54 y.o. female with a known history of hyperlipidemia and tobacco abuse. The patient rolled out of bed earlier this week and sustained rib fracturesof the left seventh and eighth rib. She she was sent back home. She continuously has a left side rib pain and started to have shortness breath and weakness since yesterday. His O2 saturation decreased to 70s to 80s even she was put on oxygen 4 L,  and she is now on nonrebreather 100% OXYGEN, but does still feel shortness of breath. The patient CAT scan of chest and abdomen show splenic laceration and rib fractures. Dr. Felton Clinton, the surgeon will see the patient. Hospitalist was requested to do consult for respiratory failure and possible COPD. Past Medical History  Diagnosis Date  . Hypercholesteremia   . Hyperlipidemia     PAST SURGICAL HISTOIRY:   Past Surgical History  Procedure Laterality Date  . Abdominal hysterectomy    . Ankle surgery Left   . Cholecystectomy    . Tonsillectomy      SOCIAL HISTORY:   History  Substance Use Topics  . Smoking status: Current Every Day Smoker -- 1.00 packs/day for 30 years  . Smokeless tobacco: Current User  . Alcohol Use: No    FAMILY HISTORY:   Family History  Problem Relation Age of Onset  . Rectal cancer Mother   . Lung cancer Father     DRUG ALLERGIES:  No Known Allergies  REVIEW OF SYSTEMS:  CONSTITUTIONAL:  no fever or chills. Generalizedweakness.  EYES: No blurred or double vision.  EARS, NOSE, AND THROAT: No tinnitus or ear pain.  RESPIRATORY:  Positive for cough and shortness of breath, no  wheezing or hemoptysis.  CARDIOVASCULAR:  left side chest pain,  no orthopnea, edema.  GASTROINTESTINAL: No nausea, vomiting, diarrhea or abdominal pain.  GENITOURINARY: No dysuria, hematuria.  ENDOCRINE: No polyuria, nocturia,  HEMATOLOGY: No anemia, easy bruising or bleeding SKIN: No rash or lesion. MUSCULOSKELETAL: No joint pain or arthritis.   NEUROLOGIC: No tingling, numbness, weakness.  PSYCHIATRY: No anxiety or depression.   MEDICATIONS AT HOME:   Prior to Admission medications   Medication Sig Start Date End Date Taking? Authorizing Provider  aspirin EC 81 MG tablet Take 81 mg by mouth daily.   Yes Historical Provider, MD  ondansetron (ZOFRAN) 4 MG tablet Take 1 tablet (4 mg total) by mouth daily as needed for nausea or vomiting. 10/29/14  Yes Earleen Newport, MD  oxybutynin (DITROPAN) 5 MG tablet Take 5 mg by mouth 2 (two) times daily.   Yes Historical Provider, MD  oxyCODONE-acetaminophen (ROXICET) 5-325 MG per tablet Take 1 tablet by mouth every 4 (four) hours as needed for severe pain. 10/29/14  Yes Loney Hering, MD  simvastatin (ZOCOR) 10 MG tablet Take 10 mg by mouth at bedtime.    Yes Historical Provider, MD  venlafaxine XR (EFFEXOR-XR) 150 MG 24 hr capsule Take 150 mg by mouth daily.    Yes Historical Provider, MD      VITAL SIGNS:  Blood pressure 133/72, pulse 111, temperature 98.4 F (36.9 C), temperature source Oral, resp. rate 26, height 5\' 7"  (1.702 m), weight 63.504 kg (140 lb), SpO2 99 %.  PHYSICAL EXAMINATION:  GENERAL:  54 y.o.-year-old patient lying in the bed with no acute distress.  EYES: Pupils equal, round, reactive to light and accommodation. No scleral icterus. Extraocular muscles intact.  HEENT: Head atraumatic, normocephalic. Oropharynx and nasopharynx clear.  NECK:  Supple, no jugular venous distention. No thyroid enlargement, no tenderness.  LUNGS:very diminished breath sounds bilaterally, no  wheezing, rales,rhonchi or crepitation. No use of accessory muscles of respiration.  left chest wall tenderness.  CARDIOVASCULAR: S1, S2 normal. No murmurs, rubs, or gallops.  ABDOMEN: Soft, nontender, nondistended. Bowel sounds present. No organomegaly or mass.  EXTREMITIES: No pedal edema, cyanosis, or clubbing.  NEUROLOGIC: Cranial nerves II through XII are intact. Muscle strength 5/5 in all extremities. Sensation intact. Gait not checked.  PSYCHIATRIC: The patient is alert and oriented x 3.  SKIN: No obvious rash, lesion, or ulcer.   LABORATORY PANEL:   CBC  Recent Labs Lab 10/31/14 1358  WBC 13.5*  HGB 7.5*  HCT 21.9*  PLT 189   ------------------------------------------------------------------------------------------------------------------  Chemistries   Recent Labs Lab 10/31/14 1358  NA 128*  K 4.0  CL 93*  CO2 27  GLUCOSE 113*  BUN 22*  CREATININE 0.90  CALCIUM 8.6*  AST 19  ALT 12*  ALKPHOS 45  BILITOT 0.2*   ------------------------------------------------------------------------------------------------------------------  Cardiac Enzymes  Recent Labs Lab 10/31/14 1358  TROPONINI <0.03   ------------------------------------------------------------------------------------------------------------------  RADIOLOGY:  Ct Chest W Contrast  10/31/2014   CLINICAL DATA:  Left-sided chest pain and shortness of breath after fall out of bed 2 days ago.  EXAM: CT CHEST WITH CONTRAST  TECHNIQUE: Multidetector CT imaging of the chest was performed during intravenous contrast administration.  CONTRAST:  98mL OMNIPAQUE IOHEXOL 300 MG/ML  SOLN  COMPARISON:  Chest radiograph of same day.  FINDINGS: No pneumothorax is noted. Mild emphysematous changes noted in the upper lobes bilaterally. Thoracic aorta appears normal. Visualized portions of pulmonary arteries appear normal. No mediastinal mass or adenopathy is noted. Mild bilateral posterior basilar atelectasis is noted  with minimal associated pleural effusions. Fluid or mucous plugging is noted within the right lower lobe bronchi.  Nondisplaced fracture is seen involving the posterior portion of the left sixth rib. Mildly displaced fracture seen involving posterior portion of the left seventh rib. Moderately displaced fracture of the posterior portion of left eighth rib is noted.  Large hematoma is seen surrounding the spleen consistent with splenic laceration.  IMPRESSION: There is noted fluid or mucous plugging within the right lower lobe bronchi.  Multiple fractures are seen involving the posterior portions of the left ribs.  Mild bilateral posterior basilar atelectasis is noted with minimal associated pleural effusions.  Large hematoma is seen surrounding the spleen consistent with splenic laceration.  Critical Value/emergent results were called by telephone at the time of interpretation on 10/31/2014 at 4:27 pm to Dr. Francene Castle , who verbally acknowledged these results.   Electronically Signed   By: Marijo Conception, M.D.   On: 10/31/2014 16:30   Dg Chest Portable 1 View  10/31/2014   CLINICAL DATA:  Fall.  EXAM: PORTABLE CHEST - 1 VIEW  COMPARISON:  10/29/2014  FINDINGS: Normal heart size. Small bilateral pleural effusions are suspected. Atelectasis is noted within the lung bases. Left posterior seventh and eighth rib fractures are again noted.  IMPRESSION: 1. Suspect small  bilateral pleural effusions. 2. Bibasilar atelectasis. 3. Left posterior rib fractures.   Electronically Signed   By: Kerby Moors M.D.   On: 10/31/2014 13:34    EKG:   Orders placed or performed during the hospital encounter of 10/31/14  . ED EKG  . ED EKG    IMPRESSION AND PLAN:   Acute respiratory failure Possible COPD Multiple rib fractures Splenic rupture Anemia due to acute blood loss Hyponatremia  Leukocytosis, possible due to reaction Dehydration  Hyperlipidemia Tobacco abuse    Recommendation.:  acute respiratory  failure, possible due to multiple rib fractures, COPD and anemia. The patient will be admitted to ICU by Dr. Felton Clinton. The patient may need a surgery for splenic rupture. Hold the patient's home aspirin and no heparin for DVT prophylaxis at this time. I will continue BiPAP, give nebulizer treatment and get a pulmonary consult. Anemia due to acute blood loss: The patient need PRBC transfusion and a follow-up CBC. I will start normal saline IV and follow-up BMP. Smoking cessation was counseled for 4 minutes, I will give nicotine patch.  All the records are reviewed and case discussed with Consulting provider. Management plans discussed with the patient, family and they are in agreement. I discussed with Dr. Felton Clinton and Dr. Thomasene Lot.  CODE STATUS: Full code  TOTAL TIME TAKING CARE OF THIS PATIENT: 63 minutes.    Demetrios Loll M.D on 10/31/2014 at 5:56 PM  Between 7am to 6pm - Pager - [phone]  After 6pm go to [email or web address] - password [email or web address] [facility]  Office  [phone]  CC: Primary care Physician: No primary care provider on file.

## 2014-10-31 NOTE — H&P (Signed)
Kelli Marks is an 54 y.o. female.    Chief Complaint:   Dizziness and weakness following fall 3 days ago.  HPI:    54 y/o cigarette smoker awoke three nights ago and fell out of bed falling onto her left side injuring several ribs.  She was seen in our emergency room at that time and was discharged home. She returns today with worsening dizziness weakness and some mild shortness of breath. On admission she had O2 saturation in the low 80's. She was placed on oxygen. CBC demonstrates a hemoglobin 7.5. Due to some chest discomfort a CT scan was obtained looking for pneumothorax. Several rib fractures were noted on the left side but no evidence of hemopneumothorax was noted.  She is on CT scan of the upper abdomen demonstrates a large perisplenic hematoma. There is no active extravasation of IV contrast noted.  Past Medical History  Diagnosis Date  . Hypercholesteremia   . Hyperlipidemia     Past Surgical History  Procedure Laterality Date  . Abdominal hysterectomy    . Ankle surgery Left   . Cholecystectomy    . Tonsillectomy      Family History  Problem Relation Age of Onset  . Rectal cancer Mother   . Lung cancer Father    Social History:  reports that she has been smoking.  She uses smokeless tobacco. She reports that she does not drink alcohol or use illicit drugs.   1 ppd for many years by her report.  Allergies: No Known Allergies  Review of Systems:   Review of Systems  Constitutional: Positive for malaise/fatigue. Negative for fever, chills and weight loss.  HENT: Negative for ear pain and tinnitus.   Eyes: Negative for blurred vision, pain and discharge.  Respiratory: Positive for shortness of breath. Negative for cough and hemoptysis.   Cardiovascular: Positive for chest pain and orthopnea. Negative for palpitations, claudication, leg swelling and PND.  Gastrointestinal: Positive for abdominal pain. Negative for heartburn, nausea, vomiting, diarrhea,  constipation and melena.  Genitourinary: Negative.   Skin: Negative.   Neurological: Positive for weakness. Negative for headaches.  Endo/Heme/Allergies: Negative.   Psychiatric/Behavioral: Negative.   All other systems reviewed and are negative.   Physical Exam:  Physical Exam  Constitutional: She is oriented to person, place, and time.  HENT:  Head: Normocephalic and atraumatic.  Eyes: Conjunctivae are normal. Pupils are equal, round, and reactive to light.  Neck: No JVD present. No tracheal deviation present. No thyromegaly present.  Cardiovascular: Normal rate, regular rhythm, normal heart sounds and intact distal pulses.   No murmur heard. Pulmonary/Chest: Effort normal and breath sounds normal. No respiratory distress.  She was on BIPAP at time of my exam.  Abdominal: Soft. Normal appearance and bowel sounds are normal. She exhibits distension. There is tenderness in the left upper quadrant.    LUQ tenderness present on exam.  Musculoskeletal: Normal range of motion.  Lymphadenopathy:    She has no cervical adenopathy.  Neurological: She is alert and oriented to person, place, and time.  Skin: Skin is dry.  Psychiatric: Mood, memory, affect and judgment normal.    Blood pressure 133/72, pulse 111, temperature 98.4 F (36.9 C), temperature source Oral, resp. rate 26, height _0  (1.702 m), weight 63.504 kg (140 lb), SpO2 99 %.    Results for orders placed or performed during the hospital encounter of 10/31/14 (from the past 48 hour(s))  CBC with Differential     Status: Abnormal  Collection Time: 10/31/14  1:58 PM  Result Value Ref Range   WBC 13.5 (H) 3.6 - 11.0 K/uL   RBC 2.36 (L) 3.80 - 5.20 MIL/uL   Hemoglobin 7.5 (L) 12.0 - 16.0 g/dL   HCT 21.9 (L) 35.0 - 47.0 %   MCV 92.5 80.0 - 100.0 fL   MCH 31.9 26.0 - 34.0 pg   MCHC 34.5 32.0 - 36.0 g/dL   RDW 13.3 11.5 - 14.5 %   Platelets 189 150 - 440 K/uL   Neutrophils Relative % 78 %   Neutro Abs 10.6 (H) 1.4  - 6.5 K/uL   Lymphocytes Relative 12 %   Lymphs Abs 1.6 1.0 - 3.6 K/uL   Monocytes Relative 9 %   Monocytes Absolute 1.2 (H) 0.2 - 0.9 K/uL   Eosinophils Relative 0 %   Eosinophils Absolute 0.0 0 - 0.7 K/uL   Basophils Relative 1 %   Basophils Absolute 0.1 0 - 0.1 K/uL  Comprehensive metabolic panel     Status: Abnormal   Collection Time: 10/31/14  1:58 PM  Result Value Ref Range   Sodium 128 (L) 135 - 145 mmol/L   Potassium 4.0 3.5 - 5.1 mmol/L   Chloride 93 (L) 101 - 111 mmol/L   CO2 27 22 - 32 mmol/L   Glucose, Bld 113 (H) 65 - 99 mg/dL   BUN 22 (H) 6 - 20 mg/dL   Creatinine, Ser 0.90 0.44 - 1.00 mg/dL   Calcium 8.6 (L) 8.9 - 10.3 mg/dL   Total Protein 6.3 (L) 6.5 - 8.1 g/dL   Albumin 3.7 3.5 - 5.0 g/dL   AST 19 15 - 41 U/L   ALT 12 (L) 14 - 54 U/L   Alkaline Phosphatase 45 38 - 126 U/L   Total Bilirubin 0.2 (L) 0.3 - 1.2 mg/dL   GFR calc non Af Amer >60 >60 mL/min   GFR calc Af Amer >60 >60 mL/min    Comment: (NOTE) The eGFR has been calculated using the CKD EPI equation. This calculation has not been validated in all clinical situations. eGFR's persistently <60 mL/min signify possible Chronic Kidney Disease.    Anion gap 8 5 - 15  Troponin I     Status: None   Collection Time: 10/31/14  1:58 PM  Result Value Ref Range   Troponin I <0.03 <0.031 ng/mL    Comment:        NO INDICATION OF MYOCARDIAL INJURY.    Ct Chest W Contrast  10/31/2014   CLINICAL DATA:  Left-sided chest pain and shortness of breath after fall out of bed 2 days ago.  EXAM: CT CHEST WITH CONTRAST  TECHNIQUE: Multidetector CT imaging of the chest was performed during intravenous contrast administration.  CONTRAST:  69m OMNIPAQUE IOHEXOL 300 MG/ML  SOLN  COMPARISON:  Chest radiograph of same day.  FINDINGS: No pneumothorax is noted. Mild emphysematous changes noted in the upper lobes bilaterally. Thoracic aorta appears normal. Visualized portions of pulmonary arteries appear normal. No mediastinal  mass or adenopathy is noted. Mild bilateral posterior basilar atelectasis is noted with minimal associated pleural effusions. Fluid or mucous plugging is noted within the right lower lobe bronchi.  Nondisplaced fracture is seen involving the posterior portion of the left sixth rib. Mildly displaced fracture seen involving posterior portion of the left seventh rib. Moderately displaced fracture of the posterior portion of left eighth rib is noted.  Large hematoma is seen surrounding the spleen consistent with splenic laceration.  IMPRESSION: There  is noted fluid or mucous plugging within the right lower lobe bronchi.  Multiple fractures are seen involving the posterior portions of the left ribs.  Mild bilateral posterior basilar atelectasis is noted with minimal associated pleural effusions.  Large hematoma is seen surrounding the spleen consistent with splenic laceration.  Critical Value/emergent results were called by telephone at the time of interpretation on 10/31/2014 at 4:27 pm to Dr. Francene Castle , who verbally acknowledged these results.   Electronically Signed   By: Marijo Conception, M.D.   On: 10/31/2014 16:30   Dg Chest Portable 1 View  10/31/2014   CLINICAL DATA:  Fall.  EXAM: PORTABLE CHEST - 1 VIEW  COMPARISON:  10/29/2014  FINDINGS: Normal heart size. Small bilateral pleural effusions are suspected. Atelectasis is noted within the lung bases. Left posterior seventh and eighth rib fractures are again noted.  IMPRESSION: 1. Suspect small bilateral pleural effusions. 2. Bibasilar atelectasis. 3. Left posterior rib fractures.   Electronically Signed   By: Kerby Moors M.D.   On: 10/31/2014 13:34   I personally reviewed all the laboratory data summarized as above and reviewed the PACs images of the CT scan.  Additionally,  completion CT scan of the abdomen and pelvis will be performed later tonight.  Assessment/Plan  54 year old female with hypoxia likely as a combination of multiple left-sided  rib fractures along with some mild pulmonary consolidation on CT scan and what appears to be a splenic laceration with a large amount of hemoperitoneum. She has significant anemia and I suspect this is causing her shortness of breath weakness and hypoxia.  She would benefit from red cell transfusion.  My plan is to admit her to the intensive care unit and ask internal medicine to assist with hypoxemia and COPD.   She is currently on BiPAP. She will get 2 units of blood. We will follow her CBC serially as well as her hemodynamics. I suspect that the remaining portions of the CT scan will demonstrate no other obvious injuries except for hemoperitoneum.   I did discuss with her briefly the possibility of needing splenectomy based on failure of non operative therapy.  Total time spent coordinating her care 60 minutes greater than 50% of time speaking with internal medicine radiology emergency room medicine staff.   Hortencia Conradi, MD, FACS

## 2014-10-31 NOTE — ED Notes (Signed)
MD removed NRB mask, placed on 4L via [place], per MD pt to remain on [place] unless O2 sats lower than 88%.

## 2014-10-31 NOTE — ED Notes (Signed)
Pt reports rolled out of bed Tuesday and hurt her left ribs. Pt was seen here and discharged with medication for pain. Pt presents today because pain is still here.

## 2014-10-31 NOTE — ED Provider Notes (Addendum)
[facility] Emergency Department Provider Note  ____________________________________________  Time seen: 1300  I have reviewed the triage vital signs and the nursing notes.   HISTORY  Chief Complaint Rib Injury and Fall  hypoxia, short of breath.      HPI Kelli Marks is a 54 y.o. female is a 54 year old female who rolled out of bed earlier this week and sustained rib fracturesof the left seventh and eighth rib. These were nondisplaced. She now returns to the emergency department with shortness of breath, moderate to severe, and a noted low oxygen level upon arrival. She began feeling more short of breath yesterday. She denies any undue or new chest pain other than the pain in the left ribs from the contusion/fracture. She does smoke cigarettes, but has not smoked any in the past 4 days. She denies any history of COPD or asthma.    Past Medical History  Diagnosis Date  . Hypercholesteremia     There are no active problems to display for this patient.   Past Surgical History  Procedure Laterality Date  . Abdominal hysterectomy    . Ankle surgery Left   . Cholecystectomy    . Tonsillectomy      Current Outpatient Rx  Name  Route  Sig  Dispense  Refill  . aspirin EC 81 MG tablet   Oral   Take 81 mg by mouth daily.         . ondansetron (ZOFRAN) 4 MG tablet   Oral   Take 1 tablet (4 mg total) by mouth daily as needed for nausea or vomiting.   20 tablet   1   . oxybutynin (DITROPAN) 5 MG tablet   Oral   Take 5 mg by mouth 2 (two) times daily.         Marland Kitchen oxyCODONE-acetaminophen (ROXICET) 5-325 MG per tablet   Oral   Take 1 tablet by mouth every 4 (four) hours as needed for severe pain.   20 tablet   0   . simvastatin (ZOCOR) 10 MG tablet   Oral   Take 10 mg by mouth at bedtime.          Marland Kitchen venlafaxine XR (EFFEXOR-XR) 150 MG 24 hr capsule   Oral   Take 150 mg by mouth daily.            Allergies Review of patient's  allergies indicates no known allergies.  No family history on file.  Social History History  Substance Use Topics  . Smoking status: Current Every Day Smoker  . Smokeless tobacco: Not on file  . Alcohol Use: No    Review of Systems  Constitutional: Negative for fever. ENT: Negative for sore throat. Cardiovascular: Positive for chest pain status post rib fracture. See history of present illness Respiratory: Positive for shortness of breath. See history of present illness  Gastrointestinal: Negative for abdominal pain, vomiting and diarrhea. Genitourinary: Negative for dysuria. Musculoskeletal: Negative for back pain. Skin: Negative for rash. Neurological: Negative for headaches   10-point ROS otherwise negative.  ____________________________________________   PHYSICAL EXAM:  VITAL SIGNS: ED Triage Vitals  Enc Vitals Group     BP 10/31/14 1243 129/54 mmHg     Pulse Rate 10/31/14 1243 112     Resp 10/31/14 1243 72     Temp 10/31/14 1243 98.4 F (36.9 C)     Temp Source 10/31/14 1243 Oral     SpO2 10/31/14 1243 72 %     Weight 10/31/14  1243 140 lb (63.504 kg)     Height 10/31/14 1243 5\' 7"  (1.702 m)     Head Cir --      Peak Flow --      Pain Score 10/31/14 1251 10     Pain Loc --      Pain Edu? --      Excl. in [place]? --     Constitutional: Alert and oriented. Mild discomfort. Patient becomes mildly tachycardic and When she does not have supplemental oxygen.Marland Kitchen ENT   Head: Normocephalic and atraumatic.   Nose: No congestion/rhinnorhea.   Mouth/Throat: Mucous membranes are moist. Cardiovascular: Tachycardia at 100 to 1:15.. Respiratory: Mild tachypnea. Breath sounds are clear and equal bilaterally. No wheezes/rales/rhonchi. Gastrointestinal: Soft and nontender. No distention.  Back: No muscle spasm, no tenderness, no CVA tenderness. Musculoskeletal: Nontender with normal range of motion in all extremities.  No noted edema. Neurologic:  Normal speech and  language. No gross focal neurologic deficits are appreciated.  Skin:  Skin is warm, dry. No rash noted. Psychiatric: Mood and affect are normal. Speech and behavior are normal.  ____________________________________________    LABS (pertinent positives/negatives)  White blood cell count 13.5 hemoglobin is low at 7.5 Sodium 128 potassium 4.0 Troponin is negative  ____________________________________________   EKG  ED ECG REPORT I, Kal Chait W, the attending physician, personally viewed and interpreted this ECG.   Date: 10/31/2014  EKG Time: 1431  Rate: 110  Rhythm: Sinus tachycardia  Axis: Normal  Intervals: Normal  ST&T Change: None   ____________________________________________    RADIOLOGY  CXR: IMPRESSION: 1. Suspect small bilateral pleural effusions. 2. Bibasilar atelectasis. 3. Left posterior rib fractures.  CT chest: IMPRESSION: There is noted fluid or mucous plugging within the right lower lobe bronchi.  Multiple fractures are seen involving the posterior portions of the left ribs.  Mild bilateral posterior basilar atelectasis is noted with minimal associated pleural effusions.  Large hematoma is seen surrounding the spleen consistent with splenic laceration.  ____________________________________________   PROCEDURES  Critical Care performed: CRITICAL CARE Performed by: Ahmed Prima   Total critical care time: 35 minutes due to the critical nature of this patient's hypoxia and splenic laceration. We discussed the case with radiology as well as with the surgeon. We rechecked the patient number of times and kept them updated   Critical care time was exclusive of separately billable procedures and treating other patients.  Critical care was necessary to treat or prevent imminent or life-threatening deterioration.  Critical care was time spent personally by me on the following activities: development of treatment plan with patient  and/or surrogate as well as nursing, discussions with consultants, evaluation of patient's response to treatment, examination of patient, obtaining history from patient or surrogate, ordering and performing treatments and interventions, ordering and review of laboratory studies, ordering and review of radiographic studies, pulse oximetry and re-evaluation of patient's condition.   ____________________________________________   INITIAL IMPRESSION / ASSESSMENT AND PLAN / ED COURSE  Patient rapidly dropped her oxygen saturation level and supplemental oxygen is removed. When I first saw her the O2 level was 96-97% with a nonrebreather on. I removed it while the x-ray plate was being placed behind her. Within 2 minutes she was down into the low 80s. I put her back on a nonrebreather. Her O2 sat level came back up to approximately 95-96. At that time her replace the nonrebreather with a nasal cannula at 4 L. The patient appears to feel to maintain her oxygen saturation level  at 90 with a nasal cannula. We will continue with the nasal cannula at this time.  A quick look of the chest x-ray on the portable x-ray machine did not show a large pneumothorax. We will do a chest CT to evaluate for a small pneumothorax. We will treat with albuterol to try to maximize her pulmonary function. I suspect there is a degree of underlying COPD given her smoking history.  ----------------------------------------- 3:57 PM on 10/31/2014 -----------------------------------------  Patient is now in CT after a short delay. I've called CT to help expedite this study.  ----------------------------------------- 4:33 PM on 10/31/2014 -----------------------------------------  I received a call at 425 from Dr. Nyoka Cowden, the radiologist who is reviewing the CT scan of this patient's chest. The patient has the noted rib fractures, but she also has an apparent hematoma over her spleen. She likely has a splenic laceration.  The CT  scan does extend all the way below the spleen due to the good work of the Biochemist, clinical. I will consult surgery to evaluate the patient and determine if any further imaging is needed.  I just rechecked the patient. She did drop her O2 sat while on nasal cannula and his back on a nonrebreather. I have asked respiratory therapy to calm and put the patient on a Venturi mask to regulate her FiO2 better. She does complain of pain. We will treat her pain with a little bit of Dilaudid now.  I spoke with Dr. Felton Clinton, surgeon, who will see the patient the emergency Department. We will also speak with the medical team for further contribution to the care of this patient with respiratory failure.  ____________________________________________  ----------------------------------------- 6:06 PM on 10/31/2014 -----------------------------------------  After further consultation with the physicians, we will plan on transfusing this patient due to her hypoxia and her drop in hemoglobin from 13 last year to 7.5 today.   FINAL CLINICAL IMPRESSION(S) / ED DIAGNOSES  Final diagnoses:  Splenic laceration, initial encounter  Acute respiratory failure with hypoxia   anemia Internal bleeding    Ahmed Prima, MD 10/31/14 1642  Ahmed Prima, MD 10/31/14 [phone]

## 2014-11-01 ENCOUNTER — Encounter: Payer: Self-pay | Admitting: Specialist

## 2014-11-01 LAB — CBC
HEMATOCRIT: 25.2 % — AB (ref 35.0–47.0)
HEMOGLOBIN: 8.7 g/dL — AB (ref 12.0–16.0)
MCH: 31.1 pg (ref 26.0–34.0)
MCHC: 34.4 g/dL (ref 32.0–36.0)
MCV: 90.4 fL (ref 80.0–100.0)
PLATELETS: 181 10*3/uL (ref 150–440)
RBC: 2.78 MIL/uL — ABNORMAL LOW (ref 3.80–5.20)
RDW: 15 % — ABNORMAL HIGH (ref 11.5–14.5)
WBC: 14.5 10*3/uL — AB (ref 3.6–11.0)

## 2014-11-01 LAB — BASIC METABOLIC PANEL
ANION GAP: 6 (ref 5–15)
BUN: 13 mg/dL (ref 6–20)
CALCIUM: 8.5 mg/dL — AB (ref 8.9–10.3)
CO2: 30 mmol/L (ref 22–32)
CREATININE: 0.6 mg/dL (ref 0.44–1.00)
Chloride: 102 mmol/L (ref 101–111)
GFR calc Af Amer: >60 mL/min (ref >60)
GFR calc non Af Amer: >60 mL/min (ref >60)
Glucose, Bld: 114 mg/dL — ABNORMAL HIGH (ref 65–99)
Potassium: 4.3 mmol/L (ref 3.5–5.1)
SODIUM: 138 mmol/L (ref 135–145)

## 2014-11-01 LAB — PROTIME-INR
INR: 1.06
PROTHROMBIN TIME: 14 s (ref 11.4–15.0)

## 2014-11-01 LAB — HEMOGLOBIN: HEMOGLOBIN: 9.8 g/dL — AB (ref 12.0–16.0)

## 2014-11-01 LAB — PREPARE RBC (CROSSMATCH)

## 2014-11-01 MED ORDER — OXYCODONE-ACETAMINOPHEN 5-325 MG PO TABS
1.0000 | ORAL_TABLET | ORAL | Status: DC | PRN
Start: 2014-11-01 — End: 2014-11-05
  Administered 2014-11-04 – 2014-11-05 (×2): 1 via ORAL
  Filled 2014-11-01 (×2): qty 1

## 2014-11-01 MED ORDER — IPRATROPIUM-ALBUTEROL 0.5-2.5 (3) MG/3ML IN SOLN
3.0000 mL | Freq: Four times a day (QID) | RESPIRATORY_TRACT | Status: DC
  Administered 2014-11-02 – 2014-11-04 (×8): 3 mL via RESPIRATORY_TRACT
  Filled 2014-11-01 (×9): qty 3

## 2014-11-01 MED ORDER — HYDROCODONE-ACETAMINOPHEN 5-325 MG PO TABS
2.0000 | ORAL_TABLET | Freq: Four times a day (QID) | ORAL | Status: DC | PRN
  Administered 2014-11-01 – 2014-11-02 (×3): 2 via ORAL
  Filled 2014-11-01 (×3): qty 2

## 2014-11-01 MED ORDER — MORPHINE SULFATE 2 MG/ML IJ SOLN
2.0000 mg | INTRAMUSCULAR | Status: DC | PRN
  Administered 2014-11-02 – 2014-11-07 (×12): 2 mg via INTRAVENOUS
  Filled 2014-11-01 (×13): qty 1

## 2014-11-01 MED ORDER — OXYBUTYNIN CHLORIDE 5 MG PO TABS
5.0000 mg | ORAL_TABLET | Freq: Two times a day (BID) | ORAL | Status: DC
  Administered 2014-11-01 – 2014-11-09 (×17): 5 mg via ORAL
  Filled 2014-11-01 (×17): qty 1

## 2014-11-01 MED ORDER — VENLAFAXINE HCL ER 150 MG PO CP24
150.0000 mg | ORAL_CAPSULE | Freq: Every day | ORAL | Status: DC
  Administered 2014-11-01 – 2014-11-02 (×2): 150 mg via ORAL
  Filled 2014-11-01: qty 2
  Filled 2014-11-01 (×2): qty 1

## 2014-11-01 MED ORDER — DEXTROSE-NACL 5-0.9 % IV SOLN
INTRAVENOUS | Status: DC
  Administered 2014-11-01 – 2014-11-02 (×3): via INTRAVENOUS
  Administered 2014-11-03: 1000 mL via INTRAVENOUS
  Administered 2014-11-03: 100 mL/h via INTRAVENOUS

## 2014-11-01 MED ORDER — IPRATROPIUM-ALBUTEROL 0.5-2.5 (3) MG/3ML IN SOLN: 3.0000 mL | Freq: Four times a day (QID) | RESPIRATORY_TRACT | Status: DC | PRN

## 2014-11-01 MED ORDER — ONDANSETRON HCL 4 MG/2ML IJ SOLN
4.0000 mg | Freq: Four times a day (QID) | INTRAMUSCULAR | Status: DC | PRN
Start: 2014-11-01 — End: 2014-11-09
  Administered 2014-11-01 – 2014-11-09 (×6): 4 mg via INTRAVENOUS
  Filled 2014-11-01 (×6): qty 2

## 2014-11-01 MED ORDER — SIMVASTATIN 20 MG PO TABS
10.0000 mg | ORAL_TABLET | Freq: Every day | ORAL | Status: DC
  Administered 2014-11-01 – 2014-11-08 (×8): 10 mg via ORAL
  Filled 2014-11-01 (×8): qty 1

## 2014-11-01 NOTE — Progress Notes (Signed)
Date: 11/01/2014,   MRN# [id number] MILEE QUALLS 1961/03/04 Code Status:  Hosp day:@LENGTHOFSTAYDAYS @ Referring MD: @ATDPROV @       HPI: 54 year old female with past medical history of COPD, depression, hyperlipidemia, chronic pain, urinary incontinence, who presented to the hospital after a fall and noted to have multiple left rib fractures, splenic laceration, s/p blood transfusions. Seem she be squinting, sats low. On HFNC at 47 %. She was on bipap initially.   PMHX:   Past Medical History  Diagnosis Date  . Hypercholesteremia   . Hyperlipidemia   . Rib fractures    Surgical Hx:  Past Surgical History  Procedure Laterality Date  . Abdominal hysterectomy    . Ankle surgery Left   . Cholecystectomy    . Tonsillectomy     Family Hx:  Family History  Problem Relation Age of Onset  . Rectal cancer Mother   . Lung cancer Father    Social Hx:   History  Substance Use Topics  . Smoking status: Current Every Day Smoker -- 1.00 packs/day for 30 years  . Smokeless tobacco: Current User  . Alcohol Use: No   Medication:    Home Medication:  No current outpatient prescriptions on file.  Current Medication: @CURMEDTAB @   Allergies:  Review of patient's allergies indicates no known allergies.  Review of Systems: Gen:  Denies  fever, sweats, chills HEENT: Denies blurred vision, double vision, ear pain, eye pain, hearing loss, nose bleeds, sore throat Cvc:  No dizziness, chest pain or heaviness Resp:  Left rib pain, increase with deep breathing and cough  Gi: Denies swallowing difficulty, stomach pain, nausea or vomiting, diarrhea, constipation, bowel incontinence Gu:  Denies bladder incontinence, burning urine Ext:   No Joint pain, stiffness or swelling Skin: No skin rash, easy bruising or bleeding or hives Endoc:  No polyuria, polydipsia , polyphagia or weight change Psych: No depression, insomnia or hallucinations  Other:  All other systems negative  Physical  Examination:   VS: BP 107/59 mmHg  Pulse 100  Temp(Src) 98.4 F (36.9 C) (Axillary)  Resp 19  Ht 5' 7"  (1.702 m)  Wt 156 lb 8.4 oz (71 kg)  BMI 24.51 kg/m2  SpO2 94%  General Appearance: No distress  NeuroPsych without focal findings, mental status, speech normal, alert and oriented, cranial nerves 2-12 intact, reflexes normal and symmetric, sensation grossly normal  HEENT: PERRLA, EOM intact, no ptosis, no other lesions noticed, Mallampati: Pulmonary:.No wheezing, No rales decrease bs on the left base Cardiovascular:  Normal S1,S2.  No m/r/g.  Abdominal aorta pulsation normal.    Abdomen:Benign, Soft, no masses, hepatosplenomegaly, No lymphadenopathy Endoc: No evident thyromegaly, no signs of acromegaly or Cushing features Skin:   warm, no rashes, no ecchymosis  Extremities: normal, no cyanosis, clubbing, no edema, warm with normal capillary refill. Other findings:   Labs results:   Recent Labs     10/31/14  1358  11/01/14  0550  11/01/14  1050  HGB  7.5*  8.7*  9.8*  HCT  21.9*  25.2*   --   MCV  92.5  90.4   --   WBC  13.5*  14.5*   --   BUN  22*  13   --   CREATININE  0.90  0.60   --   GLUCOSE  113*  114*   --   CALCIUM  8.6*  8.5*   --   INR   --   1.06   --   ,  Sodium 135 - 145 mmol/L 138 128 (L) 138R     Potassium 3.5 - 5.1 mmol/L 4.3 4.0 3.9R    Chloride 101 - 111 mmol/L 102 93 (L) 105R    CO2 22 - 32 mmol/L 30 27 32R    Glucose, Bld 65 - 99 mg/dL 114 (H) 113 (H) 93R    BUN 6 - 20 mg/dL 13 22 (H) 4 (L)R    Creatinine, Ser 0.44 - 1.00 mg/dL 0.60 0.90 0.70R    Calcium 8.9 - 10.3 mg/dL 8.5 (L) 8.6 (L) 9.2R    GFR calc non Af Amer >60 mL/min >60 60 mL/min" class="[email or web address]" style="cursor: pointer; background-color: rgb(222, 231, 239);" onmouseover='jscript: var varStyle="underline"; var bgColor="#[id number]"; this.style.backgroundColor=bgColor; var children=this.getElementsByTagName("div"); for(var child=0;child >60 >60R, CM    GFR calc Af Amer >60 mL/min >60 60  mL/min" class="[email or web address]" style="cursor: pointer; background-color: rgb(222, 231, 239);" onmouseover='jscript: var varStyle="underline"; var bgColor="#[id number]"; this.style.backgroundColor=bgColor; var children=this.getElementsByTagName("div"); for(var child=0;child >60CM >60R   Comments: (NOTE)  The eGFR has been calculated using the CKD EPI equation.  This calculation has not been validated in all clinical situations.  eGFR's persistently <60 mL/min signify possible Chronic Kidney  Disease.      Anion gap 5 - 15  6 8 1  (L)R   Resulting Agency  [facility]      Specimen Collected: 11/01/14 5:50 AM Last Resulted: 11/01/14 8:57 AM                Rad results:   Ct Chest W Contrast  10/31/2014   CLINICAL DATA:  Left-sided chest pain and shortness of breath after fall out of bed 2 days ago.  EXAM: CT CHEST WITH CONTRAST  TECHNIQUE: Multidetector CT imaging of the chest was performed during intravenous contrast administration.  CONTRAST:  45m OMNIPAQUE IOHEXOL 300 MG/ML  SOLN  COMPARISON:  Chest radiograph of same day.  FINDINGS: No pneumothorax is noted. Mild emphysematous changes noted in the upper lobes bilaterally. Thoracic aorta appears normal. Visualized portions of pulmonary arteries appear normal. No mediastinal mass or adenopathy is noted. Mild bilateral posterior basilar atelectasis is noted with minimal associated pleural effusions. Fluid or mucous plugging is noted within the right lower lobe bronchi.  Nondisplaced fracture is seen involving the posterior portion of the left sixth rib. Mildly displaced fracture seen involving posterior portion of the left seventh rib. Moderately displaced fracture of the posterior portion of left eighth rib is noted.  Large hematoma is seen surrounding the spleen consistent with splenic laceration.  IMPRESSION: There is noted fluid or mucous plugging within the right lower lobe bronchi.  Multiple fractures are seen involving  the posterior portions of the left ribs.  Mild bilateral posterior basilar atelectasis is noted with minimal associated pleural effusions.  Large hematoma is seen surrounding the spleen consistent with splenic laceration.  Critical Value/emergent results were called by telephone at the time of interpretation on 10/31/2014 at 4:27 pm to Dr. DFrancene Castle, who verbally acknowledged these results.   Electronically Signed   By: JMarijo Conception M.D.   On: 10/31/2014 16:30   Ct Abdomen Pelvis W Contrast  10/31/2014   CLINICAL DATA:  Recent fall with left rib fractures and abnormal spleen on recent chest CT  EXAM: CT ABDOMEN AND PELVIS WITH CONTRAST  TECHNIQUE: Multidetector CT imaging of the abdomen and pelvis was performed using the standard protocol following bolus administration of intravenous contrast.  CONTRAST:  766mOMNIPAQUE IOHEXOL 300 MG/ML  SOLN  COMPARISON:  None.  FINDINGS: Lung bases again demonstrate bilateral pleural effusions and bibasilar atelectatic changes. Diffuse areas of mucous plugging are again noted as well.  The liver is within normal limits. The gallbladder has been surgically removed. The spleen demonstrates evidence of a large perisplenic hematoma as well as splenic laceration along the lateral aspect of the spleen. The defect from the laceration measures approximately 3.5 cm in length. The perisplenic hematoma measures approximately 12 by 3.6 cm. The majority of the spleen appears intact within normal enhancement pattern. No definitive areas of acute hemorrhage are seen. There is free fluid within the abdomen as well as within the pelvis consistent with a small amount of intraperitoneal hemorrhage. The adrenal glands, kidneys and pancreas are within normal limits.  Aortoiliac calcifications are noted. The bladder is distended with opacified and non-opacified urine. No pelvic mass lesion is seen. The appendix is within normal limits. No inflammatory changes are seen. The osseous  structures show fractures of the seventh and eighth ribs on the left posteriorly. This is similar to that seen on recent CT examination. No other focal bony abnormality is noted.  IMPRESSION: Left rib fractures with associated splenic laceration and perisplenic hematoma. A small amount of free fluid is noted within the abdomen consistent with hemorrhage. No areas of active extravasation are identified.  Bibasilar changes similar to that seen on recent CT.   Electronically Signed   By: Inez Catalina M.D.   On: 10/31/2014 20:20     Assessment and Plan: Presently she is maintaining well considering. S/p fall with left rib fractures an spleenic laceration. Her sats are low ddx: squinting,  atelactasis, copd, at risk of pneumonia  Pain control Incentive spirometry Increase ambulation Wean fio2 as tolerated Duo neb Am cxr  I have personally obtained a history, examined the patient, evaluated laboratory and imaging results, formulated the assessment and plan and placed orders.  The Patient requires high complexity decision making for assessment and support, frequent evaluation and titration of therapies, application of advanced monitoring technologies and extensive interpretation of multiple databases.   Ayriana Wix,M.D. Pulmonary & Critical care Medicine [facility]

## 2014-11-01 NOTE — Progress Notes (Signed)
[facility] [place] NAME: Kelli Marks    MR#:  [id number]  DATE OF BIRTH:  1960/11/06  SUBJECTIVE:  CHIEF COMPLAINT:   Chief Complaint  Patient presents with  . Rib Injury  . Fall   Pt. Here w/ fall and noted to have left sided rib fractures.  Also noted to have spleen laceration.  Family at bedside. Pt. Hungry and wants to eat.  No shortness of breath.   REVIEW OF SYSTEMS:    Review of Systems  Constitutional: Negative for fever and chills.  HENT: Negative for congestion and tinnitus.   Eyes: Negative for blurred vision and double vision.  Respiratory: Negative for cough, shortness of breath and wheezing.   Cardiovascular: Positive for chest pain (pleuritic CP on left side). Negative for orthopnea, leg swelling and PND.  Gastrointestinal: Negative for nausea, vomiting, abdominal pain and diarrhea.  Genitourinary: Negative for dysuria and hematuria.  Skin: Negative for rash.  Neurological: Negative for dizziness, sensory change and focal weakness.  Endo/Heme/Allergies: Does not bruise/bleed easily.  All other systems reviewed and are negative.   Nutrition: just started on clear liquid diet Tolerating Diet: Liquid diet Tolerating PT: Await Eval.    DRUG ALLERGIES:  No Known Allergies  VITALS:  Blood pressure 115/66, pulse 94, temperature 98.4 F (36.9 C), temperature source Axillary, resp. rate 27, height 5\' 7"  (1.702 m), weight 71 kg (156 lb 8.4 oz), SpO2 95 %.  PHYSICAL EXAMINATION:   Physical Exam  GENERAL:  54 y.o.-year-old patient lying in the bed in mild resp. Distress EYES: Pupils equal, round, reactive to light and accommodation. No scleral icterus. Extraocular muscles intact.  HEENT: Head atraumatic, normocephalic. Oropharynx and nasopharynx clear.  NECK:  Supple, no jugular venous distention. No thyroid enlargement, no tenderness.  LUNGS: Poor resp. effort, no wheezing, rales, mild rhonchi b/l. No use  of accessory muscles of respiration.  CARDIOVASCULAR: S1, S2 normal. No murmurs, rubs, or gallops.  ABDOMEN: Soft, nontender, nondistended. Bowel sounds present. No organomegaly or mass.  EXTREMITIES: No cyanosis, clubbing or edema b/l.    NEUROLOGIC: Cranial nerves II through XII are intact. No focal Motor or sensory deficits b/l.   PSYCHIATRIC: The patient is alert and oriented x 3. Good Affect.  SKIN: No obvious rash, lesion, or ulcer.    LABORATORY PANEL:   CBC  Recent Labs Lab 11/01/14 0550 11/01/14 1050  WBC 14.5*  --   HGB 8.7* 9.8*  HCT 25.2*  --   PLT 181  --    ------------------------------------------------------------------------------------------------------------------  Chemistries   Recent Labs Lab 10/31/14 1358 11/01/14 0550  NA 128* 138  K 4.0 4.3  CL 93* 102  CO2 27 30  GLUCOSE 113* 114*  BUN 22* 13  CREATININE 0.90 0.60  CALCIUM 8.6* 8.5*  AST 19  --   ALT 12*  --   ALKPHOS 45  --   BILITOT 0.2*  --    ------------------------------------------------------------------------------------------------------------------  Cardiac Enzymes  Recent Labs Lab 10/31/14 1358  TROPONINI <0.03   ------------------------------------------------------------------------------------------------------------------  RADIOLOGY:  Ct Chest W Contrast  10/31/2014   CLINICAL DATA:  Left-sided chest pain and shortness of breath after fall out of bed 2 days ago.  EXAM: CT CHEST WITH CONTRAST  TECHNIQUE: Multidetector CT imaging of the chest was performed during intravenous contrast administration.  CONTRAST:  32mL OMNIPAQUE IOHEXOL 300 MG/ML  SOLN  COMPARISON:  Chest radiograph of same day.  FINDINGS: No pneumothorax is noted. Mild emphysematous  changes noted in the upper lobes bilaterally. Thoracic aorta appears normal. Visualized portions of pulmonary arteries appear normal. No mediastinal mass or adenopathy is noted. Mild bilateral posterior basilar atelectasis is  noted with minimal associated pleural effusions. Fluid or mucous plugging is noted within the right lower lobe bronchi.  Nondisplaced fracture is seen involving the posterior portion of the left sixth rib. Mildly displaced fracture seen involving posterior portion of the left seventh rib. Moderately displaced fracture of the posterior portion of left eighth rib is noted.  Large hematoma is seen surrounding the spleen consistent with splenic laceration.  IMPRESSION: There is noted fluid or mucous plugging within the right lower lobe bronchi.  Multiple fractures are seen involving the posterior portions of the left ribs.  Mild bilateral posterior basilar atelectasis is noted with minimal associated pleural effusions.  Large hematoma is seen surrounding the spleen consistent with splenic laceration.  Critical Value/emergent results were called by telephone at the time of interpretation on 10/31/2014 at 4:27 pm to Dr. Francene Castle , who verbally acknowledged these results.   Electronically Signed   By: Marijo Conception, M.D.   On: 10/31/2014 16:30   Ct Abdomen Pelvis W Contrast  10/31/2014   CLINICAL DATA:  Recent fall with left rib fractures and abnormal spleen on recent chest CT  EXAM: CT ABDOMEN AND PELVIS WITH CONTRAST  TECHNIQUE: Multidetector CT imaging of the abdomen and pelvis was performed using the standard protocol following bolus administration of intravenous contrast.  CONTRAST:  56mL OMNIPAQUE IOHEXOL 300 MG/ML  SOLN  COMPARISON:  None.  FINDINGS: Lung bases again demonstrate bilateral pleural effusions and bibasilar atelectatic changes. Diffuse areas of mucous plugging are again noted as well.  The liver is within normal limits. The gallbladder has been surgically removed. The spleen demonstrates evidence of a large perisplenic hematoma as well as splenic laceration along the lateral aspect of the spleen. The defect from the laceration measures approximately 3.5 cm in length. The perisplenic hematoma  measures approximately 12 by 3.6 cm. The majority of the spleen appears intact within normal enhancement pattern. No definitive areas of acute hemorrhage are seen. There is free fluid within the abdomen as well as within the pelvis consistent with a small amount of intraperitoneal hemorrhage. The adrenal glands, kidneys and pancreas are within normal limits.  Aortoiliac calcifications are noted. The bladder is distended with opacified and non-opacified urine. No pelvic mass lesion is seen. The appendix is within normal limits. No inflammatory changes are seen. The osseous structures show fractures of the seventh and eighth ribs on the left posteriorly. This is similar to that seen on recent CT examination. No other focal bony abnormality is noted.  IMPRESSION: Left rib fractures with associated splenic laceration and perisplenic hematoma. A small amount of free fluid is noted within the abdomen consistent with hemorrhage. No areas of active extravasation are identified.  Bibasilar changes similar to that seen on recent CT.   Electronically Signed   By: Inez Catalina M.D.   On: 10/31/2014 20:20   Dg Chest Portable 1 View  10/31/2014   CLINICAL DATA:  Fall.  EXAM: PORTABLE CHEST - 1 VIEW  COMPARISON:  10/29/2014  FINDINGS: Normal heart size. Small bilateral pleural effusions are suspected. Atelectasis is noted within the lung bases. Left posterior seventh and eighth rib fractures are again noted.  IMPRESSION: 1. Suspect small bilateral pleural effusions. 2. Bibasilar atelectasis. 3. Left posterior rib fractures.   Electronically Signed   By: Kerby Moors  M.D.   On: 10/31/2014 13:34     ASSESSMENT AND PLAN:   54 year old female with past medical history of COPD, depression, hyperlipidemia, chronic pain, urinary incontinence, who presented to the hospital after a fall and noted to have multiple left rib fractures, possible splenic laceration and also noted to be hypoxic.  #1 acute respiratory failure with  hypoxia-this is likely secondary to patient's COPD complicated with splinting due to multiple rib fractures on the left side.  - no evidence of COPD Exacerbation. CT chest showing small b/l pleural effusions.  - cont. Supportive care w/ incentive spirometry, O2 supplementation - off bipap now and HiFLo  and will monitor.  - duonebs PRN.    #2 status post fall and multiple rib fractures on the left/possible splenic laceration-hemoglobin stable. Hemodynamically stable. Continue supportive care as per surgery. No plans for surgical intervention today. Need repeat imaging in the next 24 hrs.  #3 hyperlipidemia continue simvastatin  #4 tobacco abuse-continue nicotine patch.  #5 depression-continue Effexor.  #6 urinary incontinence-continue oxybutynin.  #7 hyponatremia-improved and resolved with IV fluids.  All the records are reviewed and case discussed with Care Management/Social Workerr. Management plans discussed with the patient, family and they are in agreement.  CODE STATUS: Full  DVT Prophylaxis: TED, SCD's.   TOTAL TIME TAKING CARE OF THIS PATIENT: 30 minutes.   Discharge as per general surgery.  Henreitta Leber M.D on 11/01/2014 at 11:43 AM  Between 7am to 6pm - Pager - [phone]  After 6pm go to [email or web address] - password [email or web address] [facility]  [facility]  Office  [phone]  CC: Primary care physician; No primary care provider on file.

## 2014-11-01 NOTE — Progress Notes (Signed)
[facility]   PATIENT NAME: Kelli Marks    MR#:  [id number]  DATE OF BIRTH:  January 11, 1961  SUBJECTIVE:   Hungry, less SOB,   mild LUQ abd pain reported.   No nausea or emesis.    Total of 3 units of PRBC's given since admission. REVIEW OF SYSTEMS:   Review of Systems  Constitutional: Positive for fever. Negative for chills and weight loss.  Gastrointestinal: Positive for abdominal pain. Negative for nausea and vomiting.  All other systems reviewed and are negative.   DRUG ALLERGIES:  No Known Allergies  VITALS:  Blood pressure 118/65, pulse 104, temperature 98.4 F (36.9 C), temperature source Axillary, resp. rate 24, height 5\' 7"  (1.702 m), weight 71 kg (156 lb 8.4 oz), SpO2 95 %.  PHYSICAL EXAMINATION:  GENERAL:  54 y.o.-year-old patient lying in the bed with no acute distress. Venturi mask in place. EYES: Pupils equal, round, reactive to light and accommodation. No scleral icterus. Extraocular muscles intact.  HEENT: Head atraumatic, normocephalic. Oropharynx and nasopharynx clear.  NECK:  Supple, no jugular venous distention. No thyroid enlargement, no tenderness.  LUNGS: Normal breath sounds bilaterally, no wheezing, rales,rhonchi or crepitation. No use of accessory muscles of respiration.  CARDIOVASCULAR: S1, S2 normal. No murmurs, rubs, or gallops.  ABDOMEN: Soft, tender in LUQ, nondistended. Bowel sounds present. No organomegaly or mass.  EXTREMITIES: No pedal edema, cyanosis, or clubbing.  NEUROLOGIC: Cranial nerves II through XII are intact. Muscle strength 5/5 in all extremities. Sensation intact. Gait not checked.  PSYCHIATRIC: The patient is alert and oriented x 3.  SKIN: No obvious rash, lesion, or ulcer.    CBC Latest Ref Rng 11/01/2014 10/31/2014 02/17/2014  WBC 3.6 - 11.0 K/uL 14.5(H) 13.5(H) 8.9  Hemoglobin 12.0 - 16.0 g/dL 8.7(L) 7.5(L) 13.0  Hematocrit 35.0 - 47.0 % 25.2(L) 21.9(L) 39.1  Platelets 150 - 440 K/uL 181 189 236   BMP  Latest Ref Rng 11/01/2014 10/31/2014 02/17/2014  Glucose 65 - 99 mg/dL 114(H) 113(H) 93  BUN 6 - 20 mg/dL 13 22(H) 4(L)  Creatinine 0.44 - 1.00 mg/dL 0.60 0.90 0.70  Sodium 135 - 145 mmol/L 138 128(L) 138  Potassium 3.5 - 5.1 mmol/L 4.3 4.0 3.9  Chloride 101 - 111 mmol/L 102 93(L) 105  CO2 22 - 32 mmol/L 30 27 32  Calcium 8.9 - 10.3 mg/dL 8.5(L) 8.6(L) 9.2     ; ASSESSMENT AND PLAN:   Traumatic splenic laceration multiple left-sided rib fractures with resulting hypoxia. I see no signs of continued hemorrhage. She has remained hemodynamically stable. We will follow-up her hemoglobin later this morning.  I discussed the plan with her son family friend and the patient.  We will move the patient to see on remote telemetry. Continue bedrest.

## 2014-11-02 ENCOUNTER — Encounter
Admission: EM | Disposition: A | Discharge status: Home / Self Care | Payer: Self-pay | Source: Home / Self Care | Attending: Surgery

## 2014-11-02 ENCOUNTER — Inpatient Hospital Stay: Payer: 59 | Admitting: Anesthesiology

## 2014-11-02 HISTORY — PX: SPLENECTOMY, TOTAL: SHX788

## 2014-11-02 LAB — URINALYSIS COMPLETE WITH MICROSCOPIC (ARMC ONLY)
Bilirubin Urine: NEGATIVE
Glucose, UA: NEGATIVE mg/dL
HGB URINE DIPSTICK: NEGATIVE
KETONES UR: NEGATIVE mg/dL
LEUKOCYTES UA: NEGATIVE
Nitrite: NEGATIVE
PROTEIN: NEGATIVE mg/dL
Specific Gravity, Urine: 1.024 (ref 1.005–1.030)
pH: 5 (ref 5.0–8.0)

## 2014-11-02 LAB — CBC
HCT: 20.2 % — ABNORMAL LOW (ref 35.0–47.0)
HCT: 18.4 % — ABNORMAL LOW (ref 35.0–47.0)
HCT: 22.6 % — ABNORMAL LOW (ref 35.0–47.0)
HEMOGLOBIN: 6.7 g/dL — AB (ref 12.0–16.0)
HEMOGLOBIN: 6 g/dL — AB (ref 12.0–16.0)
HEMOGLOBIN: 7.5 g/dL — AB (ref 12.0–16.0)
MCH: 29.9 pg (ref 26.0–34.0)
MCH: 29.7 pg (ref 26.0–34.0)
MCH: 29.2 pg (ref 26.0–34.0)
MCHC: 32.9 g/dL (ref 32.0–36.0)
MCHC: 32.6 g/dL (ref 32.0–36.0)
MCHC: 33.1 g/dL (ref 32.0–36.0)
MCV: 90.8 fL (ref 80.0–100.0)
MCV: 91.1 fL (ref 80.0–100.0)
MCV: 88.1 fL (ref 80.0–100.0)
PLATELETS: 216 10*3/uL (ref 150–440)
PLATELETS: 220 10*3/uL (ref 150–440)
Platelets: 223 10*3/uL (ref 150–440)
RBC: 2.22 MIL/uL — ABNORMAL LOW (ref 3.80–5.20)
RBC: 2.02 MIL/uL — AB (ref 3.80–5.20)
RBC: 2.56 MIL/uL — AB (ref 3.80–5.20)
RDW: 15.2 % — AB (ref 11.5–14.5)
RDW: 14.6 % — ABNORMAL HIGH (ref 11.5–14.5)
RDW: 16 % — AB (ref 11.5–14.5)
WBC: 18.1 10*3/uL — AB (ref 3.6–11.0)
WBC: 17.5 10*3/uL — ABNORMAL HIGH (ref 3.6–11.0)
WBC: 22.7 10*3/uL — ABNORMAL HIGH (ref 3.6–11.0)

## 2014-11-02 LAB — BLOOD GAS, ARTERIAL
ACID-BASE EXCESS: 2 mmol/L (ref 0.0–3.0)
Allens test (pass/fail): POSITIVE — AB
Bicarbonate: 27.3 mEq/L (ref 21.0–28.0)
FIO2: 0.9 %
O2 Saturation: 94.3 %
PCO2 ART: 44 mmHg (ref 32.0–48.0)
PEEP: 8 cmH2O
PH ART: 7.4 (ref 7.350–7.450)
PO2 ART: 72 mmHg — AB (ref 83.0–108.0)
Patient temperature: 37
RATE: 16 resp/min
VT: 500 mL

## 2014-11-02 LAB — BASIC METABOLIC PANEL
Anion gap: 6 (ref 5–15)
BUN: 20 mg/dL (ref 6–20)
CO2: 26 mmol/L (ref 22–32)
Calcium: 7.5 mg/dL — ABNORMAL LOW (ref 8.9–10.3)
Chloride: 105 mmol/L (ref 101–111)
Creatinine, Ser: 0.78 mg/dL (ref 0.44–1.00)
GFR calc Af Amer: >60 mL/min (ref >60)
GFR calc non Af Amer: >60 mL/min (ref >60)
Glucose, Bld: 175 mg/dL — ABNORMAL HIGH (ref 65–99)
Potassium: 3.8 mmol/L (ref 3.5–5.1)
SODIUM: 137 mmol/L (ref 135–145)

## 2014-11-02 LAB — GLUCOSE, CAPILLARY: Glucose-Capillary: 152 mg/dL — ABNORMAL HIGH (ref 65–99)

## 2014-11-02 LAB — PREPARE RBC (CROSSMATCH)

## 2014-11-02 LAB — MRSA PCR SCREENING: MRSA by PCR: NEGATIVE

## 2014-11-02 SURGERY — SPLENECTOMY
Anesthesia: General

## 2014-11-02 MED ORDER — FENTANYL CITRATE (PF) 100 MCG/2ML IJ SOLN
INTRAMUSCULAR | Status: DC | PRN
  Administered 2014-11-02: 50 ug via INTRAVENOUS
  Administered 2014-11-02: 150 ug via INTRAVENOUS
  Administered 2014-11-02: 50 ug via INTRAVENOUS

## 2014-11-02 MED ORDER — CEFAZOLIN SODIUM 1-5 GM-% IV SOLN
INTRAVENOUS | Status: DC | PRN
  Administered 2014-11-02: 1 g via INTRAVENOUS

## 2014-11-02 MED ORDER — SODIUM CHLORIDE 0.9 % IV SOLN
INTRAVENOUS | Status: DC | PRN
  Administered 2014-11-02 (×2): via INTRAVENOUS

## 2014-11-02 MED ORDER — ONDANSETRON HCL 4 MG/2ML IJ SOLN: 4.0000 mg | Freq: Once | INTRAMUSCULAR | Status: AC | PRN

## 2014-11-02 MED ORDER — GLYCOPYRROLATE 0.2 MG/ML IJ SOLN
INTRAMUSCULAR | Status: DC | PRN
  Administered 2014-11-02: 0.6 mg via INTRAVENOUS

## 2014-11-02 MED ORDER — MICROFIBRILLAR COLL HEMOSTAT EX PADS
MEDICATED_PAD | CUTANEOUS | Status: AC
  Filled 2014-11-02: qty 1

## 2014-11-02 MED ORDER — NEOSTIGMINE METHYLSULFATE 10 MG/10ML IV SOLN
INTRAVENOUS | Status: DC | PRN
  Administered 2014-11-02: 4 mg via INTRAVENOUS
  Administered 2014-11-02: 1 mg via INTRAVENOUS

## 2014-11-02 MED ORDER — SODIUM CHLORIDE 0.9 % IV SOLN
Freq: Once | INTRAVENOUS | Status: AC
  Administered 2014-11-02: 13:00:00 via INTRAVENOUS

## 2014-11-02 MED ORDER — SODIUM CHLORIDE 0.9 % IV SOLN
Freq: Once | INTRAVENOUS | Status: AC
  Administered 2014-11-02: 19:00:00 via INTRAVENOUS

## 2014-11-02 MED ORDER — PROMETHAZINE HCL 25 MG/ML IJ SOLN
12.5000 mg | INTRAMUSCULAR | Status: DC | PRN
  Administered 2014-11-02: 12.5 mg via INTRAVENOUS
  Filled 2014-11-02: qty 1

## 2014-11-02 MED ORDER — MIDAZOLAM HCL 2 MG/2ML IJ SOLN
INTRAMUSCULAR | Status: DC | PRN
  Administered 2014-11-02 (×2): 2 mg via INTRAVENOUS

## 2014-11-02 MED ORDER — SUCCINYLCHOLINE CHLORIDE 20 MG/ML IJ SOLN
INTRAMUSCULAR | Status: DC | PRN
  Administered 2014-11-02: 100 mg via INTRAVENOUS

## 2014-11-02 MED ORDER — PROPOFOL 1000 MG/100ML IV EMUL
5.0000 ug/kg/min | INTRAVENOUS | Status: DC
Start: 2014-11-02 — End: 2014-11-04
  Administered 2014-11-02: 50 ug/kg/min via INTRAVENOUS
  Administered 2014-11-02: 74.574 ug/kg/min via INTRAVENOUS
  Administered 2014-11-02: 50 ug/kg/min via INTRAVENOUS
  Administered 2014-11-03: 79.545 ug/kg/min via INTRAVENOUS
  Administered 2014-11-03: 70 ug/kg/min via INTRAVENOUS
  Administered 2014-11-03: 80 ug/kg/min via INTRAVENOUS
  Administered 2014-11-03: 60 ug/kg/min via INTRAVENOUS
  Administered 2014-11-03: 79.545 ug/kg/min via INTRAVENOUS
  Administered 2014-11-03 (×2): 80 ug/kg/min via INTRAVENOUS
  Administered 2014-11-04: 59.659 ug/kg/min via INTRAVENOUS
  Filled 2014-11-02 (×11): qty 100

## 2014-11-02 MED ORDER — HYDROMORPHONE HCL 1 MG/ML IJ SOLN: 0.2500 mg | INTRAMUSCULAR | Status: DC | PRN

## 2014-11-02 MED ORDER — ROCURONIUM BROMIDE 100 MG/10ML IV SOLN
INTRAVENOUS | Status: DC | PRN
  Administered 2014-11-02: 50 mg via INTRAVENOUS
  Administered 2014-11-02: 1 mg via INTRAVENOUS
  Administered 2014-11-02: 40 mg via INTRAVENOUS
  Administered 2014-11-02: 10 mg via INTRAVENOUS

## 2014-11-02 MED ORDER — PROPOFOL 10 MG/ML IV BOLUS
INTRAVENOUS | Status: DC | PRN
  Administered 2014-11-02: 80 mg via INTRAVENOUS

## 2014-11-02 MED ORDER — FENTANYL CITRATE (PF) 100 MCG/2ML IJ SOLN
25.0000 ug | INTRAMUSCULAR | Status: DC | PRN
  Administered 2014-11-02 – 2014-11-03 (×2): 25 ug via INTRAVENOUS
  Filled 2014-11-02 (×2): qty 2

## 2014-11-02 MED ORDER — PROPOFOL 1000 MG/100ML IV EMUL
INTRAVENOUS | Status: AC
  Administered 2014-11-02: 50 ug/kg/min via INTRAVENOUS
  Filled 2014-11-02: qty 100

## 2014-11-02 SURGICAL SUPPLY — 29 items
CANISTER SUCT 1200ML W/VALVE (MISCELLANEOUS) ×3 IMPLANT
CANISTER SUCT 3000ML (MISCELLANEOUS) ×3 IMPLANT
CATH TRAY 16F METER LATEX (MISCELLANEOUS) ×3 IMPLANT
CHLORAPREP W/TINT 26ML (MISCELLANEOUS) ×3 IMPLANT
DRAIN CHANNEL 19F RND (DRAIN) ×3 IMPLANT
DRAPE LAPAROTOMY 100X77 ABD (DRAPES) ×3 IMPLANT
DRAPE SHEET LG 3/4 BI-LAMINATE (DRAPES) ×3 IMPLANT
DRAPE UTILITY 15X26 TOWEL STRL (DRAPES) ×6 IMPLANT
ELECT CAUTERY BLADE 6.4 (BLADE) ×3 IMPLANT
GAUZE SPONGE 4X4 12PLY STRL (GAUZE/BANDAGES/DRESSINGS) ×3 IMPLANT
GLOVE BIO SURGEON STRL SZ7.5 (GLOVE) ×9 IMPLANT
GOWN STRL REUS W/ TWL LRG LVL3 (GOWN DISPOSABLE) ×1 IMPLANT
GOWN STRL REUS W/ TWL XL LVL3 (GOWN DISPOSABLE) ×1 IMPLANT
GOWN STRL REUS W/TWL LRG LVL3 (GOWN DISPOSABLE) ×2
GOWN STRL REUS W/TWL XL LVL3 (GOWN DISPOSABLE) ×2
KIT RM TURNOVER STRD PROC AR (KITS) ×3 IMPLANT
LABEL OR SOLS (LABEL) ×3 IMPLANT
LIGASURE 5MM LAPAROSCOPIC (INSTRUMENTS) ×3 IMPLANT
NS IRRIG 1000ML POUR BTL (IV SOLUTION) ×3 IMPLANT
PACK BASIN MAJOR ARMC (MISCELLANEOUS) ×3 IMPLANT
PAD ABD DERMACEA PRESS 5X9 (GAUZE/BANDAGES/DRESSINGS) ×6 IMPLANT
PAD GROUND ADULT SPLIT (MISCELLANEOUS) ×3 IMPLANT
SPONGE LAP 18X18 5 PK (GAUZE/BANDAGES/DRESSINGS) ×6 IMPLANT
STAPLER SKIN PROX 35W (STAPLE) ×3 IMPLANT
SUT CHROMIC 0 CT 1 (SUTURE) ×3 IMPLANT
SUT CHROMIC 3 0 SH 27 (SUTURE) ×9 IMPLANT
SUT TICRON 2-0 30IN 311381 (SUTURE) ×3 IMPLANT
SUT VIC AB 1 CTX 27 (SUTURE) ×9 IMPLANT
SUT VICRYL PLUS ABS 0 54 (SUTURE) ×9 IMPLANT

## 2014-11-02 NOTE — Progress Notes (Signed)
[facility]   PATIENT NAME: Kelli Marks    MR#:  [id number]  DATE OF BIRTH:  1961/02/21  SUBJECTIVE:   She is having more left upper quadrant pain today. Her shortness of breath is unchanged. She remains on high flow nasal cannula. REVIEW OF SYSTEMS:   Review of Systems  Constitutional: Negative for fever and chills.  Respiratory: Positive for cough and shortness of breath. Negative for hemoptysis, sputum production and wheezing.   Gastrointestinal: Positive for abdominal pain. Negative for vomiting.  All other systems reviewed and are negative.   DRUG ALLERGIES:  No Known Allergies  VITALS:  Blood pressure 92/57, pulse 124, temperature 97.4 F (36.3 C), temperature source Axillary, resp. rate 17, height 5\' 7"  (1.702 m), weight 70.444 kg (155 lb 4.8 oz), SpO2 95 %.   Filed Vitals:   11/02/14 2423 11/02/14 0747 11/02/14 0756 11/02/14 1159  BP:   92/57   Pulse: 110  124   Temp:   97.4 F (36.3 C)   TempSrc:   Axillary   Resp:   17   Height:      Weight:      SpO2: 97% 96% 95% 95%    PHYSICAL EXAMINATION:  GENERAL:  54 y.o.-year-old patient lying in the bed with no acute distress.  EYES: Pupils equal, round, reactive to light and accommodation. No scleral icterus. Extraocular muscles intact.  HEENT: Head atraumatic, normocephalic. Oropharynx and nasopharynx clear.  NECK:  Supple, no jugular venous distention. No thyroid enlargement, no tenderness.  LUNGS: Normal breath sounds bilaterally, no wheezing, rales,rhonchi or crepitation. No use of accessory muscles of respiration.  CARDIOVASCULAR: S1, S2 normal. No murmurs, rubs, or gallops.  ABDOMEN: Her abdomen appears more distended. Exquisitely tender in the left upper quadrant compared to yesterday.  EXTREMITIES: No pedal edema, cyanosis, or clubbing.  NEUROLOGIC: Cranial nerves II through XII are intact. Muscle strength 5/5 in all extremities. Sensation intact. Gait not checked.  PSYCHIATRIC: [name] alert and oriented x 3.  SKIN: No obvious rash, lesion, or ulcer.   CBC Latest Ref Rng 11/02/2014 11/02/2014 11/01/2014  WBC 3.6 - 11.0 K/uL 17.5(H) 18.1(H) -  Hemoglobin 12.0 - 16.0 g/dL 6.0(L) 6.7(L) 9.8(L)  Hematocrit 35.0 - 47.0 % 18.4(L) 20.2(L) -  Platelets 150 - 440 K/uL 220 216 -    BMP Latest Ref Rng 11/01/2014 10/31/2014 02/17/2014  Glucose 65 - 99 mg/dL 114(H) 113(H) 93  BUN 6 - 20 mg/dL 13 22(H) 4(L)  Creatinine 0.44 - 1.00 mg/dL 0.60 0.90 0.70  Sodium 135 - 145 mmol/L 138 128(L) 138  Potassium 3.5 - 5.1 mmol/L 4.3 4.0 3.9  Chloride 101 - 111 mmol/L 102 93(L) 105  CO2 22 - 32 mmol/L 30 27 32  Calcium 8.9 - 10.3 mg/dL 8.5(L) 8.6(L) 9.2      ASSESSMENT AND PLAN:    54 year old white female with a fractured spleen 5 days status post trauma who initially responded to blood transfusion and conservative non-operative management. There was a delay in reporting of her initial 5 AM hemoglobin. This was found to be 6.7. Her hemoglobin on repeat 6.0. She has been remaining intermittently tachycardic systolic blood pressures in the 90s.  Her abdomen is more distended and concerning for worsening pain in the left upper quadrant compared to yesterday's examination.   This patient has failed nonoperative management for splenic rupture.  Plan is for an 2 units PRBCs.  I discussed with her and her family members present urgent laparotomy and splenectomy.  She will require post surgical immunizations prior to discharge. She understands and wishes to proceed and she understands that she may need intensive care unit admission postoperatively.

## 2014-11-02 NOTE — Progress Notes (Signed)
Surgery  Hemodynamically stable  On vent support post surgery  JP serosang  drg ok abd soft Cbc pending  Family updated.

## 2014-11-02 NOTE — Progress Notes (Signed)
Notified dr. reddy of elevated HR. Continue to monitor per md; tachycardia is likely related to pain. Will continue to monitor.

## 2014-11-02 NOTE — Op Note (Signed)
10/31/2014 - 11/02/2014  3:09 PM  PATIENT:  Kelli Marks  54 y.o. female  PRE-OPERATIVE DIAGNOSIS:  splenic laceration  POST-OPERATIVE DIAGNOSIS:  same  PROCEDURE:  Splenectomy  SURGEON:  Surgeon(s) and Role:     Sherri Rad, MD - Primary  ASSISTANTS: Scrub tech  ANESTHESIA: General.  SPECIMEN: spleen  EBL: minimal operative blood loss,  3 liters blood and clot in abdomen and left upper quadrant.  Description of procedure:    With the patient in the supine position general oral endotracheal anesthesia was induced. The patient's abdomen was widely prepped and draped core prep solution and a time out was observed.   A primary left upper quadrant transversely oriented subcostal skin incision was fashion with scalpel and carried down through muscular fascial layers electrocautery. Self-retaining abdominal retractor was placed after the left upper quadrant was packed with several laparotomy tapes there was a large amount of clotted blood. The spleen essentially had no capsule on it. It was rotated medially. The splenic vessels were clamped utilizing large Kelly clamps and doubly ligated utilizing #00 silk. The left upper quadrant was irrigated with several liters of warm saline and all clot was evacuated. One small bleeding point on the splenic hilum was suture ligated with a figure-of-eight #000 silk suture.  With lap and needle count correct 2 the remaining abdomen was irrigated and aspirated dry.  A 19 mm Blake drain was directed into the splenic fossa. 2 large pieces of Surgicel was placed over the splenic hilum.  With lap and needle count correct 2 the abdominal fascia was reapproximated utilizing running #1 Vicryls sutures in 2 layers from the extremes of the wound. Subcutaneous niece tissues were irrigated and skin was closed with a skin staple applier. Sterile dressing was applied. The patient was then taken to the recovery room in stable and satisfactory condition by anesthesia  services.

## 2014-11-02 NOTE — Anesthesia Procedure Notes (Signed)
Date/Time: 11/02/2014 1:53 PM Performed by: Jonna Clark Pre-anesthesia Checklist: Patient identified, Emergency Drugs available, Suction available, Patient being monitored and Timeout performed Patient Re-evaluated:Patient Re-evaluated prior to inductionOxygen Delivery Method: Circle system utilized Preoxygenation: Pre-oxygenation with 100% oxygen Intubation Type: IV induction Ventilation: Mask ventilation without difficulty Laryngoscope Size: Mac and 4 Grade View: Grade II Tube type: Oral Tube size: 7.0 mm Number of attempts: 1 Placement Confirmation: ETT inserted through vocal cords under direct vision,  positive ETCO2 and breath sounds checked- equal and bilateral Secured at: 21 cm Tube secured with: Tape Dental Injury: Teeth and Oropharynx as per pre-operative assessment

## 2014-11-02 NOTE — Anesthesia Preprocedure Evaluation (Signed)
Anesthesia Evaluation  Patient identified by MRN, date of birth, ID band Patient awake    Reviewed: Allergy & Precautions, H&P , NPO status , Patient's Chart, lab work & pertinent test results, reviewed documented beta blocker date and time   Airway Mallampati: II  TM Distance: >3 FB Neck ROM: full    Dental  (+) Edentulous Upper, Edentulous Lower   Pulmonary Current Smoker,          Cardiovascular Rate:Normal     Neuro/Psych    GI/Hepatic   Endo/Other    Renal/GU      Musculoskeletal   Abdominal   Peds  Hematology  (+) anemia ,   Anesthesia Other Findings   Reproductive/Obstetrics                             Anesthesia Physical Anesthesia Plan  ASA: III  Anesthesia Plan: General ETT   Post-op Pain Management:    Induction:   Airway Management Planned:   Additional Equipment:   Intra-op Plan:   Post-operative Plan:   Informed Consent: I have reviewed the patients History and Physical, chart, labs and discussed the procedure including the risks, benefits and alternatives for the proposed anesthesia with the patient or authorized representative who has indicated his/her understanding and acceptance.     Plan Discussed with: CRNA  Anesthesia Plan Comments:         Anesthesia Quick Evaluation

## 2014-11-02 NOTE — Progress Notes (Signed)
[place] [facility] [place] NAME: Kelli Marks    MR#:  [id number]  DATE OF BIRTH:  [birth year]  SUBJECTIVE:  CHIEF COMPLAINT:   Chief Complaint  Patient presents with  . Rib Injury  . Fall   Pt. Here w/ fall and noted to have left sided rib fractures.  Also noted to have spleen laceration.   Hemoglobin has dropped from 9.8 to 6 today.  Patient is tachycardic and mildly hypotensive but arousable and awake. REVIEW OF SYSTEMS:    Review of Systems  Constitutional: Positive for malaise/fatigue. Negative for fever and chills.  HENT: Negative for congestion and tinnitus.   Eyes: Negative for blurred vision and double vision.  Respiratory: Negative for cough, shortness of breath and wheezing.   Cardiovascular: Negative for chest pain, orthopnea and PND.  Gastrointestinal: Positive for abdominal pain. Negative for nausea, vomiting and diarrhea.       Abdominal distension  Genitourinary: Negative for dysuria and hematuria.  Skin: Negative for rash.  Neurological: Positive for weakness. Negative for dizziness, sensory change, focal weakness and headaches.  All other systems reviewed and are negative.   Nutrition: ice chips.  Tolerating Diet: very little Tolerating PT: Await Eval.    DRUG ALLERGIES:  No Known Allergies  VITALS:  Blood pressure 92/57, pulse 124, temperature 97.4 F (36.3 C), temperature source Axillary, resp. rate 17, height 5\' 7"  (1.702 m), weight 70.444 kg (155 lb 4.8 oz), SpO2 95 %.  PHYSICAL EXAMINATION:   Physical Exam  GENERAL:  54 y.o.-year-old pale appearing patient lying in the bed in mild resp. Distress EYES: Pupils equal, round, reactive to light and accommodation. Pale conjunctiva, No scleral icterus. Extraocular muscles intact.  HEENT: Head atraumatic, normocephalic. Oropharynx and nasopharynx clear.  NECK:  Supple, no jugular venous distention. No thyroid enlargement, no tenderness.  LUNGS: Poor resp.  effort, no wheezing, rales, mild rhonchi b/l. No use of accessory muscles of respiration.  CARDIOVASCULAR: S1, S2 normal. No murmurs, rubs, or gallops.  ABDOMEN: Soft, distended, hypoactive BS. No organomegaly or mass.  EXTREMITIES: No cyanosis, clubbing or edema b/l.    NEUROLOGIC: Cranial nerves II through XII are intact. No focal Motor or sensory deficits b/l. Globally weak.   PSYCHIATRIC: The patient is alert and oriented x 3. Good Affect.  SKIN: No obvious rash, lesion, or ulcer.    LABORATORY PANEL:   CBC  Recent Labs Lab 11/02/14 1033  WBC 17.5*  HGB 6.0*  HCT 18.4*  PLT 220   ------------------------------------------------------------------------------------------------------------------  Chemistries   Recent Labs Lab 10/31/14 1358 11/01/14 0550  NA 128* 138  K 4.0 4.3  CL 93* 102  CO2 27 30  GLUCOSE 113* 114*  BUN 22* 13  CREATININE 0.90 0.60  CALCIUM 8.6* 8.5*  AST 19  --   ALT 12*  --   ALKPHOS 45  --   BILITOT 0.2*  --    ------------------------------------------------------------------------------------------------------------------  Cardiac Enzymes  Recent Labs Lab 10/31/14 1358  TROPONINI <0.03   ------------------------------------------------------------------------------------------------------------------  RADIOLOGY:  Ct Chest W Contrast  10/31/2014   CLINICAL DATA:  Left-sided chest pain and shortness of breath after fall out of bed 2 days ago.  EXAM: CT CHEST WITH CONTRAST  TECHNIQUE: Multidetector CT imaging of the chest was performed during intravenous contrast administration.  CONTRAST:  43mL OMNIPAQUE IOHEXOL 300 MG/ML  SOLN  COMPARISON:  Chest radiograph of same day.  FINDINGS: No pneumothorax is noted. Mild emphysematous changes noted in the upper  lobes bilaterally. Thoracic aorta appears normal. Visualized portions of pulmonary arteries appear normal. No mediastinal mass or adenopathy is noted. Mild bilateral posterior basilar  atelectasis is noted with minimal associated pleural effusions. Fluid or mucous plugging is noted within the right lower lobe bronchi.  Nondisplaced fracture is seen involving the posterior portion of the left sixth rib. Mildly displaced fracture seen involving posterior portion of the left seventh rib. Moderately displaced fracture of the posterior portion of left eighth rib is noted.  Large hematoma is seen surrounding the spleen consistent with splenic laceration.  IMPRESSION: There is noted fluid or mucous plugging within the right lower lobe bronchi.  Multiple fractures are seen involving the posterior portions of the left ribs.  Mild bilateral posterior basilar atelectasis is noted with minimal associated pleural effusions.  Large hematoma is seen surrounding the spleen consistent with splenic laceration.  Critical Value/emergent results were called by telephone at the time of interpretation on 10/31/2014 at 4:27 pm to Dr. Francene Castle , who verbally acknowledged these results.   Electronically Signed   By: Marijo Conception, M.D.   On: 10/31/2014 16:30   Ct Abdomen Pelvis W Contrast  10/31/2014   CLINICAL DATA:  Recent fall with left rib fractures and abnormal spleen on recent chest CT  EXAM: CT ABDOMEN AND PELVIS WITH CONTRAST  TECHNIQUE: Multidetector CT imaging of the abdomen and pelvis was performed using the standard protocol following bolus administration of intravenous contrast.  CONTRAST:  20mL OMNIPAQUE IOHEXOL 300 MG/ML  SOLN  COMPARISON:  None.  FINDINGS: Lung bases again demonstrate bilateral pleural effusions and bibasilar atelectatic changes. Diffuse areas of mucous plugging are again noted as well.  The liver is within normal limits. The gallbladder has been surgically removed. The spleen demonstrates evidence of a large perisplenic hematoma as well as splenic laceration along the lateral aspect of the spleen. The defect from the laceration measures approximately 3.5 cm in length. The  perisplenic hematoma measures approximately 12 by 3.6 cm. The majority of the spleen appears intact within normal enhancement pattern. No definitive areas of acute hemorrhage are seen. There is free fluid within the abdomen as well as within the pelvis consistent with a small amount of intraperitoneal hemorrhage. The adrenal glands, kidneys and pancreas are within normal limits.  Aortoiliac calcifications are noted. The bladder is distended with opacified and non-opacified urine. No pelvic mass lesion is seen. The appendix is within normal limits. No inflammatory changes are seen. The osseous structures show fractures of the seventh and eighth ribs on the left posteriorly. This is similar to that seen on recent CT examination. No other focal bony abnormality is noted.  IMPRESSION: Left rib fractures with associated splenic laceration and perisplenic hematoma. A small amount of free fluid is noted within the abdomen consistent with hemorrhage. No areas of active extravasation are identified.  Bibasilar changes similar to that seen on recent CT.   Electronically Signed   By: Inez Catalina M.D.   On: 10/31/2014 20:20   Dg Chest Portable 1 View  10/31/2014   CLINICAL DATA:  Fall.  EXAM: PORTABLE CHEST - 1 VIEW  COMPARISON:  10/29/2014  FINDINGS: Normal heart size. Small bilateral pleural effusions are suspected. Atelectasis is noted within the lung bases. Left posterior seventh and eighth rib fractures are again noted.  IMPRESSION: 1. Suspect small bilateral pleural effusions. 2. Bibasilar atelectasis. 3. Left posterior rib fractures.   Electronically Signed   By: Kerby Moors M.D.   On: 10/31/2014  13:34     ASSESSMENT AND PLAN:   54 year old female with past medical history of COPD, depression, hyperlipidemia, chronic pain, urinary incontinence, who presented to the hospital after a fall and noted to have multiple left rib fractures, possible splenic laceration and also noted to be hypoxic.  #1 acute  respiratory failure with hypoxia-this is likely secondary to patient's COPD complicated with splinting due to multiple rib fractures on the left side.  - no evidence of COPD Exacerbation. CT chest showing small b/l pleural effusions.  - cont. Supportive care w/ incentive spirometry, O2 supplementation - Off BiPAP, was on high flow nasal cannula and O2 sats are good and now will wean to just nasal cannula 4-5 L. - Continue duonebs PRN.    #2 acute blood loss anemia-this is likely secondary to the spleen laceration. -Patient's hemoglobin has fallen from 9.8-6 overnight. We'll transfuse 2 units of packed red blood cells. Follow serial hemoglobin. -Notified surgery about the following hemoglobin, patient likely to go OR for possible urgent splenectomy.  #3 status post fall and multiple rib fractures on the left/possible splenic laceration - Patient now has acute blood loss anemia due to laceration and likely needs urgent splenectomy. -Continue care as per surgery and patient likely to go to operating room later today  #4 hyperlipidemia continue simvastatin  #5 tobacco abuse-continue nicotine patch.  #6 depression-continue Effexor.  #7 urinary incontinence-continue oxybutynin.  #8 hyponatremia-improved and resolved with IV fluids.  Discussed plan of care w/ Pt. And also Dr. Marina Gravel  All the records are reviewed and case discussed with Care Management/Social Workerr. Management plans discussed with the patient, family and they are in agreement.  CODE STATUS: Full  DVT Prophylaxis: TED, SCD's.   TOTAL TIME TAKING CARE OF THIS PATIENT: 30 minutes.   Discharge as per general surgery.  Henreitta Leber M.D on 11/02/2014 at 12:02 PM  Between 7am to 6pm - Pager - [phone]  After 6pm go to [email or web address] - password [email or web address] [facility]  [facility]  Office  [phone]  CC: Primary care physician; No primary care provider on file.

## 2014-11-02 NOTE — Transfer of Care (Signed)
Immediate Anesthesia Transfer of Care Note  Patient: Carmelina Peal  Procedure(s) Performed: Procedure(s): SPLENECTOMY (N/A)  Patient Location: PACU and ICU  Anesthesia Type:General  Level of Consciousness: sedated, unresponsive and Patient remains intubated per anesthesia plan  Airway & Oxygen Therapy: Patient remains intubated per anesthesia plan  Post-op Assessment: Report given to RN and Post -op Vital signs reviewed and stable  Post vital signs: Reviewed and stable  Last Vitals:  Filed Vitals:   11/02/14 1540  BP: 131/85  Pulse: 114  Temp: 37.2 C  Resp: 25    Complications: No apparent anesthesia complications

## 2014-11-03 ENCOUNTER — Inpatient Hospital Stay: Payer: 59

## 2014-11-03 ENCOUNTER — Encounter: Payer: Self-pay | Admitting: Surgery

## 2014-11-03 LAB — BASIC METABOLIC PANEL
ANION GAP: 6 (ref 5–15)
BUN: 14 mg/dL (ref 6–20)
CO2: 27 mmol/L (ref 22–32)
Calcium: 7.3 mg/dL — ABNORMAL LOW (ref 8.9–10.3)
Chloride: 107 mmol/L (ref 101–111)
Creatinine, Ser: 0.66 mg/dL (ref 0.44–1.00)
GFR calc Af Amer: >60 mL/min (ref >60)
GFR calc non Af Amer: >60 mL/min (ref >60)
Glucose, Bld: 133 mg/dL — ABNORMAL HIGH (ref 65–99)
Potassium: 3.9 mmol/L (ref 3.5–5.1)
Sodium: 140 mmol/L (ref 135–145)

## 2014-11-03 LAB — CBC
HCT: 22.6 % — ABNORMAL LOW (ref 35.0–47.0)
HEMOGLOBIN: 7.3 g/dL — AB (ref 12.0–16.0)
MCH: 28.5 pg (ref 26.0–34.0)
MCHC: 32.4 g/dL (ref 32.0–36.0)
MCV: 87.9 fL (ref 80.0–100.0)
Platelets: 203 10*3/uL (ref 150–440)
RBC: 2.57 MIL/uL — AB (ref 3.80–5.20)
RDW: 16 % — ABNORMAL HIGH (ref 11.5–14.5)
WBC: 21 10*3/uL — ABNORMAL HIGH (ref 3.6–11.0)

## 2014-11-03 LAB — PROTIME-INR
INR: 1.07
Prothrombin Time: 14.1 seconds (ref 11.4–15.0)

## 2014-11-03 LAB — PREPARE RBC (CROSSMATCH)

## 2014-11-03 LAB — HEMOGLOBIN: Hemoglobin: 7.6 g/dL — ABNORMAL LOW (ref 12.0–16.0)

## 2014-11-03 MED ORDER — CEFAZOLIN SODIUM 1-5 GM-% IV SOLN
1.0000 g | Freq: Four times a day (QID) | INTRAVENOUS | Status: AC
  Administered 2014-11-03 – 2014-11-04 (×4): 1 g via INTRAVENOUS
  Filled 2014-11-03 (×5): qty 50

## 2014-11-03 MED ORDER — FENTANYL 2500MCG IN NS 250ML (10MCG/ML) PREMIX INFUSION
25.0000 ug/h | INTRAVENOUS | Status: DC
  Administered 2014-11-03 – 2014-11-05 (×2): 25 ug/h via INTRAVENOUS
  Filled 2014-11-03 (×2): qty 250

## 2014-11-03 MED ORDER — NOREPINEPHRINE BITARTRATE 1 MG/ML IV SOLN: 10.0000 ug/min | INTRAVENOUS | Status: DC

## 2014-11-03 MED ORDER — SODIUM CHLORIDE 0.9 % IV SOLN
Freq: Once | INTRAVENOUS | Status: AC
  Administered 2014-11-03: 13:00:00 via INTRAVENOUS

## 2014-11-03 MED ORDER — NOREPINEPHRINE BITARTRATE 1 MG/ML IV SOLN
10.0000 ug/min | INTRAVENOUS | Status: DC
  Filled 2014-11-03: qty 4

## 2014-11-03 MED ORDER — ACETAMINOPHEN 650 MG RE SUPP
650.0000 mg | RECTAL | Status: DC | PRN
  Filled 2014-11-03: qty 1

## 2014-11-03 MED ORDER — NOREPINEPHRINE 4 MG/250ML-% IV SOLN: 10.0000 ug/min | INTRAVENOUS | Status: DC

## 2014-11-03 MED ORDER — VENLAFAXINE HCL 25 MG PO TABS
50.0000 mg | ORAL_TABLET | Freq: Three times a day (TID) | ORAL | Status: DC
  Administered 2014-11-03 – 2014-11-09 (×18): 50 mg via ORAL
  Filled 2014-11-03 (×3): qty 2
  Filled 2014-11-03: qty 1
  Filled 2014-11-03 (×10): qty 2
  Filled 2014-11-03: qty 1
  Filled 2014-11-03 (×4): qty 2
  Filled 2014-11-03: qty 1
  Filled 2014-11-03 (×2): qty 2

## 2014-11-03 NOTE — Progress Notes (Signed)
Date: 11/03/2014,   MRN# [id number] Kelli Marks 1961-02-03   HPI: Intubated, sluggish, s/p splenectomy. Lacerated spleen with repeat bleed. In the icu on the vent.  On fio2 0.5, marginal sats. Partially sedated.   PMHX:   Past Medical History  Diagnosis Date  . Hypercholesteremia   . Hyperlipidemia   . Rib fractures    Surgical Hx:  Past Surgical History  Procedure Laterality Date  . Abdominal hysterectomy    . Ankle surgery Left   . Cholecystectomy    . Tonsillectomy    . 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 Rectal cancer Mother   . Lung cancer Father    Social Hx:   History  Substance Use Topics  . Smoking status: Current Every Day Smoker -- 1.00 packs/day for 30 years  . Smokeless tobacco: Current User  . Alcohol Use: No   Medication:    Home Medication:  No current outpatient prescriptions on file.  Current Medication: @CURMEDTAB @   Allergies:  Review of patient's allergies indicates no known allergies.  Review of Systems: Partialy intubated  Physical Examination:   VS: BP 96/56 mmHg  Pulse 107  Temp(Src) 99.6 F (37.6 C) (Axillary)  Resp 19  Ht 5\' 7"  (1.702 m)  Wt 156 lb 4.9 oz (70.9 kg)  BMI 24.48 kg/m2  SpO2 95%  General Appearance: No distress  Neuro: without focal findings, mental status, speech normal, alert and oriented, cranial nerves 2-12 intact, reflexes normal and symmetric, sensation grossly normal  HEENT: PERRLA, EOM intact, no ptosis, no other lesions noticed, Mallampati: Pulmonary: coarse, rare wheeze, decrease bs at the base   Cardiovascular:  Normal S1,S2.  No m/r/g.  Abdominal aorta pulsation normal.    Abdomen:Benign, Soft, non-tender, No masses, hepatosplenomegaly, No lymphadenopathy Endoc: No evident thyromegaly, no signs of acromegaly or Cushing  features Skin:   warm, no rashes, no ecchymosis  Extremities: normal, no cyanosis, clubbing, no edema, warm with normal capillary refill. Other findings:   Labs results:   Recent Labs     10/31/14  1358  11/01/14  0550   11/02/14  0602  11/02/14  1033  11/02/14  1706  11/02/14  1709  11/03/14  0447  HGB  7.5*  8.7*   < >  6.7*  6.0*  7.5*   --   7.3*  HCT  21.9*  25.2*   --   20.2*  18.4*  22.6*   --   22.6*  MCV  92.5  90.4   --   90.8  91.1  88.1   --   87.9  WBC  13.5*  14.5*   --   18.1*  17.5*  22.7*   --   21.0*  BUN  22*  13   --    --    --    --   20  14  CREATININE  0.90  0.60   --    --    --    --   0.78  0.66  GLUCOSE  113*  114*   --    --    --    --   175*  133*  CALCIUM  8.6*  8.5*   --    --    --    --   7.5*  7.3*  INR   --   1.06   --    --    --    --    --   1.07   < > = values in this interval not displayed.  ,  IMPRESSION: chest ct There is noted fluid or mucous plugging within the right lower lobe bronchi.  Multiple fractures are seen involving the posterior portions of the left ribs.  Mild bilateral posterior basilar atelectasis is noted with minimal associated pleural effusions.  Large hematoma is seen surrounding the spleen consistent with splenic laceration.  Critical Value/emergent results were called by telephone at the time of interpretation on 10/31/2014 at 4:27 pm to Dr. Francene Castle , who verbally acknowledged these results.  IMPRESSION: abdominal ct Left rib fractures with associated splenic laceration and perisplenic hematoma. A small amount of free fluid is noted within the abdomen consistent with hemorrhage. No areas of active extravasation are identified.  Bibasilar changes similar to that seen on recent CT.     Assessment and Plan: S/p fall with left rib fractures and spleenic laceration.  Status post  Splenectomy last pm Post op on vent, attempting to wake up and wean fio2  Seem unlikely today  -weaning  fio2 -wake up -? SBT  -Duo neb -Am CXR -Post op care as directed by general surgery   I have personally obtained a history, examined the patient, evaluated laboratory and imaging results, formulated the assessment and plan and placed orders.  The Patient requires high complexity decision making for assessment and support, frequent evaluation and titration of therapies, application of advanced monitoring technologies and extensive interpretation of multiple databases.   Herbon Fleming,M.D. Pulmonary & Critical care Medicine [facility]

## 2014-11-03 NOTE — Progress Notes (Signed)
Initial Nutrition Assessment  DOCUMENTATION CODES:     INTERVENTION:   (If unable to wean from vent within 24-48 hr recommend  starting nutrition support)  NUTRITION DIAGNOSIS:  Inadequate oral intake related to acute illness as evidenced by NPO status.    GOAL:  Provide needs based on ASPEN/SCCM guidelines    MONITOR:   (Pulmonary profile, Electrolyte and renal profile, Energy intake)  REASON FOR ASSESSMENT:  Ventilator    ASSESSMENT:  Pt admitted with rib injury and ruptured spleen. S/p splenectomy and intubated.  Past Medical History  Diagnosis Date  . Hypercholesteremia   . Hyperlipidemia   . Rib fractures    No family at bedside at this time unable to determine intake and wt history  Electrolyte and Renal Profile:    Recent Labs Lab 11/01/14 0550 11/02/14 1709 11/03/14 0447  BUN 13 20 14   CREATININE 0.60 0.78 0.66  NA 138 137 140  K 4.3 3.8 3.9    Medications: D5 NS at 137ml/hr, diprivan at 33.68ml/hr at this time Height:  Ht Readings from Last 1 Encounters:  10/31/14 5\' 7"  (1.702 m)    Weight:  Wt Readings from Last 1 Encounters:  11/03/14 156 lb 4.9 oz (70.9 kg)      Wt Readings from Last 10 Encounters:  11/03/14 156 lb 4.9 oz (70.9 kg)  10/29/14 145 lb (65.772 kg)    BMI:  Body mass index is 24.48 kg/(m^2).  Estimated Nutritional Needs:  Kcal:  will reassess once on vent >24 hr  Protein:  (1.2-2.0 g/d) 85-142 g/d (Using 71kg)  Fluid:  (25-69ml/kg) 1775-2176ml/d  Skin:  Reviewed, no issues  Diet Order:     EDUCATION NEEDS:  No education needs identified at this time   Intake/Output Summary (Last 24 hours) at 11/03/14 1342 Last data filed at 11/03/14 0624  Gross per 24 hour  Intake 3236.7 ml  Output    535 ml  Net 2701.7 ml    Last BM:  5/26  HIGH Care Level Othell Jaime B. Zenia Resides, [place], [place] (pager)

## 2014-11-03 NOTE — Outcomes Assessment (Signed)
Good day.  1 unit PRBCS given. After blood O2 sats improved. Added Fentanyl drip flor sedation and titrating Propofol down. Venalafaxine changed to regular release- crushable. Fanily in and out.  Family concerned about patient but understand she needs to rest.Temp. 100.3 x 1.

## 2014-11-03 NOTE — Progress Notes (Signed)
1 Day Post-Op   Subjective:  She remained sedated and intubated.  Vital signs in last 24 hours: Temp:  [97.8 F (36.6 C)-99.6 F (37.6 C)] 99.6 F (37.6 C) (05/30 0900) Pulse Rate:  [55-131] 107 (05/30 1000) Resp:  [5-33] 19 (05/30 1000) BP: (89-131)/(48-85) 101/48 mmHg (05/30 1000) SpO2:  [89 %-96 %] 95 % (05/30 1000) FiO2 (%):  [32 %-100 %] 50 % (05/30 0804) Weight:  [70.9 kg (156 lb 4.9 oz)] 70.9 kg (156 lb 4.9 oz) (05/30 4132) Last BM Date: 10/30/14  Intake/Output from previous day: 05/29 0701 - 05/30 0700 In: 3828.7 [I.V.:3108.7; Blood:720] Out: 535 [Urine:200; Drains:235; Blood:100]  GI: Her abdomen is soft but she is sedated. She has moderate drainage in the JP drain. The wound looks good. She has hypoactive bowel sounds.  Lab Results:  CBC  Recent Labs  11/02/14 1706 11/03/14 0447  WBC 22.7* 21.0*  HGB 7.5* 7.3*  HCT 22.6* 22.6*  PLT 223 203   CMP     Component Value Date/Time   NA 140 11/03/2014 0447   NA 138 02/17/2014 2020   K 3.9 11/03/2014 0447   K 3.9 02/17/2014 2020   CL 107 11/03/2014 0447   CL 105 02/17/2014 2020   CO2 27 11/03/2014 0447   CO2 32 02/17/2014 2020   GLUCOSE 133* 11/03/2014 0447   GLUCOSE 93 02/17/2014 2020   BUN 14 11/03/2014 0447   BUN 4* 02/17/2014 2020   CREATININE 0.66 11/03/2014 0447   CREATININE 0.70 02/17/2014 2020   CALCIUM 7.3* 11/03/2014 0447   CALCIUM 9.2 02/17/2014 2020   PROT 6.3* 10/31/2014 1358   PROT 6.6 02/17/2014 2020   ALBUMIN 3.7 10/31/2014 1358   ALBUMIN 3.6 02/17/2014 2020   AST 19 10/31/2014 1358   AST 12* 02/17/2014 2020   ALT 12* 10/31/2014 1358   ALT 16 02/17/2014 2020   ALKPHOS 45 10/31/2014 1358   ALKPHOS 67 02/17/2014 2020   BILITOT 0.2* 10/31/2014 1358   GFRNONAA >60 11/03/2014 0447   GFRNONAA >60 02/17/2014 2020   GFRAA >60 11/03/2014 0447   GFRAA >60 02/17/2014 2020   PT/INR  Recent Labs  11/01/14 0550 11/03/14 0447  LABPROT 14.0 14.1  INR 1.06 1.07    Studies/Results: No  results found.  Assessment/Plan: Overall she is improving. She has no significant hypotension at present. She is off pressors. We'll give her another unit of blood today as her hemoglobin remains in the mid sevens. She is on 50% FiO2 and remains ventilator challenge. Overall she is stable from a surgical point of view. We certainly appreciate the internal medicine assistance in this difficult case.

## 2014-11-03 NOTE — Progress Notes (Signed)
[place] [facility] [place] NAME: Kelli Marks    MR#:  [id number]  DATE OF BIRTH:  1960/12/21  SUBJECTIVE:  CHIEF COMPLAINT:   Chief Complaint  Patient presents with  . Rib Injury  . Fall   S/p Splenectomy POD # 1.  3 lites of blood/clot removed from abdominal cavity as per surgical note.  Hemodynamically stable.  Hg. Stable.  Off vasopressor.  Currently intubated & sedated.  REVIEW OF SYSTEMS:    Review of Systems  Unable to perform ROS as pt. Is sedated & intubated.   Nutrition: NPO as pt. Is sedated & intubated.  Tolerating Diet: No   DRUG ALLERGIES:  No Known Allergies  VITALS:  Blood pressure 96/51, pulse 102, temperature 98.5 F (36.9 C), temperature source Axillary, resp. rate 17, height 5\' 7"  (1.702 m), weight 70.9 kg (156 lb 4.9 oz), SpO2 93 %.  PHYSICAL EXAMINATION:   Physical Exam  GENERAL:  54 y.o.-year-old critically appearing patient lying in the bed in no distress.  EYES: PERRL, Pale conjunctiva, No scleral icterus.  HEENT: Head atraumatic, normocephalic. Oropharynx and nasopharynx clear.  ET tube in place.  NECK:  Supple, no jugular venous distention. No thyroid enlargement, no tenderness.  LUNGS: upper airway wheezing, no rales, rhonchi.  No use of accessory muscles of respiration.  CARDIOVASCULAR: S1, S2 normal. No murmurs, rubs, or gallops.  ABDOMEN: Soft, hypoactive BS, slightly distended. Left UQ dressing from recent splenectomy. No organomegaly or mass.  EXTREMITIES: No cyanosis, clubbing or edema b/l.    NEUROLOGIC: sedated & intubated.  As per nursing does following commands when sedated weaned.  PSYCHIATRIC: sedated & intubated.  SKIN: No obvious rash, lesion, or ulcer.    LABORATORY PANEL:   CBC  Recent Labs Lab 11/03/14 0447  WBC 21.0*  HGB 7.3*  HCT 22.6*  PLT 203    ------------------------------------------------------------------------------------------------------------------  Chemistries   Recent Labs Lab 10/31/14 1358  11/03/14 0447  NA 128*  < > 140  K 4.0  < > 3.9  CL 93*  < > 107  CO2 27  < > 27  GLUCOSE 113*  < > 133*  BUN 22*  < > 14  CREATININE 0.90  < > 0.66  CALCIUM 8.6*  < > 7.3*  AST 19  --   --   ALT 12*  --   --   ALKPHOS 45  --   --   BILITOT 0.2*  --   --   < > = values in this interval not displayed. ------------------------------------------------------------------------------------------------------------------  Cardiac Enzymes  Recent Labs Lab 10/31/14 1358  TROPONINI <0.03   ------------------------------------------------------------------------------------------------------------------  RADIOLOGY:  No results found.   ASSESSMENT AND PLAN:   54 year old female with past medical history of COPD, depression, hyperlipidemia, chronic pain, urinary incontinence, who presented to the hospital after a fall and noted to have multiple left rib fractures, possible splenic laceration and also noted to be hypoxic.  #1 acute respiratory failure with hypoxia-pt. Now intubated post-operatively.  - still no evidence of COPD Exacerbation.  - Pulm. Consulted for vent management and cont. Care as per them.  - will get ABG this a.m. And follow as pt. Is on 50% FiO2.    #2 acute blood loss anemia-this is likely secondary to the spleen laceration. -Patient's hemoglobin had fallen from 9.8-6 on 5/28-5/29. Transfused a total of 5 units since yesterday and Hg. Still 7.3 this am.  Will give another unit today..  - s/p  Splenectomy POD # 1. Follow serial Hg.  #3 status post fall and multiple rib fractures on the left/splenic laceration - s/p Splenectomy POD # 1.  Cont. Pain control & Care as per Gen. Surgery.   #4 hyperlipidemia continue simvastatin  #5 tobacco abuse-continue nicotine patch.  #6 depression-continue  Effexor.  #7 urinary incontinence-continue oxybutynin.  #8 hyponatremia-resolved w/ IV fluids.    All the records are reviewed and case discussed with Care Management/Social Workerr. Management plans discussed with the patient, family and they are in agreement.  CODE STATUS: Full  DVT Prophylaxis: TED, SCD's.   TOTAL Critical care TIME TAKING CARE OF THIS PATIENT: 30 minutes.   Discharge as per general surgery.  Henreitta Leber M.D on 11/03/2014 at 9:04 AM  Between 7am to 6pm - Pager - [phone]  After 6pm go to [email or web address] - password [email or web address] [facility]  [facility]  Office  [phone]  CC: Primary care physician; No primary care provider on file.

## 2014-11-03 NOTE — Care Management Note (Signed)
Case Management Note  Patient Details  Name: RICHARDINE PEPPERS MRN: [id number] Date of Birth: December 19, 1960  Subjective/Objective:  Admitted from home following a fall from bed that resulted in multiple ribs fractures and laceration to the spleen. 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  Pt has received 5 units of PRBC due to low HGB. HGB today is 7.3. Will receive another unit of blood today. Remains vented and sedated on propofol. Receiving IV antibiotics and in sinus tach/107.      Action/Plan:   Expected Discharge Date:                  Expected Discharge Plan:     In-House Referral:     Discharge planning Services     Post Acute Care Choice:    Choice offered to:     DME Arranged:    DME Agency:     HH Arranged:    HH Agency:     Status of Service:  In process, will continue to follow  Medicare Important Message Given:    Date Medicare IM Given:    Medicare IM give by:    Date Additional Medicare IM Given:    Additional Medicare Important Message give by:     If discussed at [place] of Stay Meetings, dates discussed:    Additional Comments:  Jolly Mango, RN 11/03/2014, 11:09 AM

## 2014-11-04 ENCOUNTER — Inpatient Hospital Stay: Payer: 59

## 2014-11-04 LAB — BLOOD GAS, ARTERIAL
ACID-BASE EXCESS: 5 mmol/L — AB (ref 0.0–3.0)
ACID-BASE EXCESS: 4.5 mmol/L — AB (ref 0.0–3.0)
ALLENS TEST (PASS/FAIL): POSITIVE — AB
Allens test (pass/fail): POSITIVE — AB
BICARBONATE: 29.9 meq/L — AB (ref 21.0–28.0)
BICARBONATE: 30.4 meq/L — AB (ref 21.0–28.0)
FIO2: 0.5 %
FIO2: 0.45 %
LHR: 10 {breaths}/min
MECHVT: 500 mL
MECHVT: 500 mL
Mechanical Rate: 16
O2 SAT: 87.1 %
O2 Saturation: 94.9 %
PATIENT TEMPERATURE: 37
PCO2 ART: 44 mmHg (ref 32.0–48.0)
PCO2 ART: 49 mmHg — AB (ref 32.0–48.0)
PEEP: 8 cmH2O
PH ART: 7.44 (ref 7.350–7.450)
PH ART: 7.4 (ref 7.350–7.450)
PO2 ART: 51 mmHg — AB (ref 83.0–108.0)
Patient temperature: 37
RATE: 16 resp/min
pO2, Arterial: 75 mmHg — ABNORMAL LOW (ref 83.0–108.0)

## 2014-11-04 LAB — LIPID PANEL
Cholesterol: 110 mg/dL (ref 0–200)
HDL: 22 mg/dL — ABNORMAL LOW (ref >40)
LDL Cholesterol: 55 mg/dL (ref 0–99)
Total CHOL/HDL Ratio: 5 RATIO
Triglycerides: 164 mg/dL — ABNORMAL HIGH (ref <150)
VLDL: 33 mg/dL (ref 0–40)

## 2014-11-04 LAB — GLUCOSE, CAPILLARY
Glucose-Capillary: 94 mg/dL (ref 65–99)
Glucose-Capillary: 86 mg/dL (ref 65–99)
Glucose-Capillary: 93 mg/dL (ref 65–99)

## 2014-11-04 LAB — BASIC METABOLIC PANEL
Anion gap: 9 (ref 5–15)
BUN: 8 mg/dL (ref 6–20)
CALCIUM: 7.8 mg/dL — AB (ref 8.9–10.3)
CHLORIDE: 103 mmol/L (ref 101–111)
CO2: 29 mmol/L (ref 22–32)
Creatinine, Ser: 0.49 mg/dL (ref 0.44–1.00)
GFR calc non Af Amer: >60 mL/min (ref >60)
Glucose, Bld: 86 mg/dL (ref 65–99)
Potassium: 3.3 mmol/L — ABNORMAL LOW (ref 3.5–5.1)
Sodium: 141 mmol/L (ref 135–145)

## 2014-11-04 LAB — CBC
HEMATOCRIT: 23.8 % — AB (ref 35.0–47.0)
Hemoglobin: 7.7 g/dL — ABNORMAL LOW (ref 12.0–16.0)
MCH: 28.8 pg (ref 26.0–34.0)
MCHC: 32.2 g/dL (ref 32.0–36.0)
MCV: 89.4 fL (ref 80.0–100.0)
Platelets: 265 10*3/uL (ref 150–440)
RBC: 2.66 MIL/uL — ABNORMAL LOW (ref 3.80–5.20)
RDW: 15.9 % — ABNORMAL HIGH (ref 11.5–14.5)
WBC: 21 10*3/uL — ABNORMAL HIGH (ref 3.6–11.0)

## 2014-11-04 LAB — PHOSPHORUS: Phosphorus: 3.1 mg/dL (ref 2.5–4.6)

## 2014-11-04 LAB — MAGNESIUM: Magnesium: 2.1 mg/dL (ref 1.7–2.4)

## 2014-11-04 MED ORDER — CETYLPYRIDINIUM CHLORIDE 0.05 % MT LIQD
7.0000 mL | Freq: Four times a day (QID) | OROMUCOSAL | Status: DC
  Administered 2014-11-04 – 2014-11-06 (×9): 7 mL via OROMUCOSAL

## 2014-11-04 MED ORDER — DOCUSATE SODIUM 50 MG/5ML PO LIQD
50.0000 mg | Freq: Two times a day (BID) | ORAL | Status: DC
  Administered 2014-11-04 – 2014-11-05 (×2): 50 mg via ORAL
  Filled 2014-11-04 (×2): qty 10

## 2014-11-04 MED ORDER — CHLORHEXIDINE GLUCONATE 0.12 % MT SOLN
15.0000 mL | Freq: Two times a day (BID) | OROMUCOSAL | Status: DC
  Administered 2014-11-04 – 2014-11-06 (×4): 15 mL via OROMUCOSAL

## 2014-11-04 MED ORDER — POTASSIUM CHLORIDE 20 MEQ PO PACK
20.0000 meq | PACK | Freq: Once | ORAL | Status: AC
  Administered 2014-11-04: 20 meq via ORAL
  Filled 2014-11-04: qty 1

## 2014-11-04 MED ORDER — VITAL HIGH PROTEIN PO LIQD
1000.0000 mL | ORAL | Status: DC
  Administered 2014-11-04 (×2)

## 2014-11-04 MED ORDER — SENNA 8.6 MG PO TABS
1.0000 | ORAL_TABLET | Freq: Two times a day (BID) | ORAL | Status: DC
  Administered 2014-11-04 – 2014-11-06 (×4): 8.6 mg via ORAL
  Filled 2014-11-04 (×4): qty 1

## 2014-11-04 MED ORDER — FREE WATER
25.0000 mL | Status: DC
  Administered 2014-11-04 – 2014-11-05 (×6): 25 mL

## 2014-11-04 MED ORDER — OXYCODONE HCL 5 MG PO TABS
5.0000 mg | ORAL_TABLET | Freq: Four times a day (QID) | ORAL | Status: DC
  Administered 2014-11-04 – 2014-11-05 (×5): 5 mg via ORAL
  Filled 2014-11-04 (×5): qty 1

## 2014-11-04 MED ORDER — CLONAZEPAM 0.5 MG PO TABS
0.5000 mg | ORAL_TABLET | Freq: Two times a day (BID) | ORAL | Status: DC
  Administered 2014-11-04 – 2014-11-05 (×3): 0.5 mg via ORAL
  Filled 2014-11-04 (×3): qty 1

## 2014-11-04 NOTE — Progress Notes (Signed)
[place] [facility] [place] NAME: Kelli Marks    MR#:  [id number]  DATE OF BIRTH:  08-17-1960  SUBJECTIVE:  CHIEF COMPLAINT:   Chief Complaint  Patient presents with  . Rib Injury  . Fall   S/p Splenectomy POD # 1.  3 lites of blood/clot removed from abdominal cavity as per surgical note.  Hemodynamically stable.  Hg. Stable.  Off vasopressor.  Currently intubated & sedated.  REVIEW OF SYSTEMS:    Review of Systems  Unable to perform ROS: critical illness  as pt. Is sedated & intubated.   Nutrition: NPO as pt. Is sedated & intubated.  Tolerating Diet: No   DRUG ALLERGIES:  No Known Allergies  VITALS:  Blood pressure 107/60, pulse 116, temperature 98.9 F (37.2 C), temperature source Oral, resp. rate 18, height 5' 7" (1.702 m), weight 156 lb 4.9 oz (70.9 kg), SpO2 94 %.  PHYSICAL EXAMINATION:   Physical Exam  Constitutional: She appears distressed.  HENT:  Head: Normocephalic and atraumatic.  Eyes: Pupils are equal, round, and reactive to light. No scleral icterus.  Neck: Normal range of motion. Neck supple.  Cardiovascular: Normal rate and regular rhythm.   No murmur heard. Pulmonary/Chest: She is in respiratory distress. She has no wheezes. She has rales.  resp distress  Abdominal: Soft. She exhibits no distension. There is no tenderness.  Musculoskeletal: She exhibits no edema.  Neurological: She displays normal reflexes. Coordination normal.  gcs<8T  Skin: Skin is warm. No rash noted. She is diaphoretic.    Physical Exam  Constitutional: She appears distressed.  HENT:  Head: Normocephalic and atraumatic.  Eyes: Pupils are equal, round, and reactive to light. No scleral icterus.  Neck: Normal range of motion. Neck supple.  Cardiovascular: Normal rate and regular rhythm.   No murmur heard. Pulmonary/Chest: She is in respiratory distress. She has no wheezes. She has rales.  resp distress  Abdominal: Soft. She  exhibits no distension. There is no tenderness.  Musculoskeletal: She exhibits no edema.  Neurological: She displays normal reflexes. Coordination normal.  gcs<8T  Skin: Skin is warm. No rash noted. She is diaphoretic.      LABORATORY PANEL:   CBC  Recent Labs Lab 11/03/14 0447 11/03/14 2013  WBC 21.0*  --   HGB 7.3* 7.6*  HCT 22.6*  --   PLT 203  --    ------------------------------------------------------------------------------------------------------------------  Chemistries   Recent Labs Lab 10/31/14 1358  11/03/14 0447  NA 128*  < > 140  K 4.0  < > 3.9  CL 93*  < > 107  CO2 27  < > 27  GLUCOSE 113*  < > 133*  BUN 22*  < > 14  CREATININE 0.90  < > 0.66  CALCIUM 8.6*  < > 7.3*  AST 19  --   --   ALT 12*  --   --   ALKPHOS 45  --   --   BILITOT 0.2*  --   --   < > = values in this interval not displayed. ------------------------------------------------------------------------------------------------------------------  Cardiac Enzymes  Recent Labs Lab 10/31/14 1358  TROPONINI <0.03   ------------------------------------------------------------------------------------------------------------------  RADIOLOGY:  Dg Abd 1 View  11/03/2014   CLINICAL DATA:  Evaluate orogastric tube placement.  EXAM: ABDOMEN - 1 VIEW  COMPARISON:  CT, 10/31/2014.  FINDINGS: Orogastric tube passes below the diaphragm well into the stomach, well positioned.  Surgical clips in left upper quadrant noted with surgical drain that  also projects in left upper quadrant.  Bowel gas pattern is unremarkable.  IMPRESSION: Orogastric tube is well positioned, passing well within the stomach.   Electronically Signed   By: Lajean Manes M.D.   On: 11/03/2014 13:27   Dg Chest Port 1 View  11/04/2014   CLINICAL DATA:  Acute respiratory failure. On ventilator. 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  EXAM: PORTABLE CHEST - 1 VIEW  COMPARISON:  10/31/2014  FINDINGS: Endotracheal tube and  nasogastric tube are seen in appropriate position. Surgical drain and skin staples are seen in the left upper quadrant of the abdomen. No pneumothorax visualized. Left greater than right basilar atelectasis and probable small left pleural effusion are increased since previous study. Heart size remains normal.  IMPRESSION: Increased bibasilar atelectasis and probable small left pleural effusion. No pneumothorax visualized.   Electronically Signed   By: Earle Gell M.D.   On: 11/04/2014 08:45     ASSESSMENT AND PLAN:   54 year old female with past medical history of COPD, depression, hyperlipidemia, chronic pain, urinary incontinence, who presented to the hospital after a s/p  fall and noted to have multiple left rib fractures, s/p splenic laceration s/p splenectomy. Transferred to ICU for post op resp hypoxia with left sided effusion/with possible contusion  1.Respiratory Failure -continue Full MV support -continue Bronchodilator Therapy -Wean Fio2 and PEEP as tolerated -will perform SAT/SBt when respiratory parameters are met  2.Left sided effusion/opacity-possible contusion -will need to consider CT chest  Patient has failed SAT/SBt-will re-asses in AM   I have personally obtained a history, examined the patient, evaluated Pertinent laboratory and [employer]/imaging results, and  formulated the assessment and plan   The Patient requires high complexity decision making for assessment and support, frequent evaluation and titration of therapies, application of advanced monitoring technologies and extensive interpretation of multiple databases. Critical Care Time devoted to patient care services described in this note is 55 mins.   Overall, patient is critically ill, prognosis is guarded.  Patient with Multiorgan failure and at high risk for cardiac arrest and death.    Corrin Parker, M.D. Pulmonary & [place] Director Intensive Care Unit

## 2014-11-04 NOTE — Progress Notes (Signed)
[facility] [place] at [place] NAME: Kelli Marks    MR#:  [id number]  DATE OF BIRTH:  02-May-1961  SUBJECTIVE:  CHIEF COMPLAINT:   Chief Complaint  Patient presents with  . Rib Injury  . Fall    REVIEW OF SYSTEMS:  Review of Systems  Unable to perform ROS: intubated    DRUG ALLERGIES:  No Known Allergies  VITALS:  Blood pressure 109/51, pulse 95, temperature 98.9 F (37.2 C), temperature source Oral, resp. rate 16, height 5\' 7"  (1.702 m), weight 70.8 kg (156 lb 1.4 oz), SpO2 94 %.  PHYSICAL EXAMINATION:  Physical Exam  Constitutional: She is well-developed, well-nourished, and in no distress. She is intubated.  HENT:  Head: Normocephalic and atraumatic.  Eyes: Conjunctivae and EOM are normal. Pupils are equal, round, and reactive to light.  Neck: Normal range of motion. Neck supple. No tracheal deviation present. No thyromegaly present.  Cardiovascular: Normal rate, regular rhythm and normal heart sounds.   Pulmonary/Chest: Effort normal. She is intubated. No respiratory distress. She has decreased breath sounds in the right lower field. She has no wheezes. She has rhonchi. She has rales. She exhibits no tenderness.  Abdominal: Soft. Bowel sounds are normal. She exhibits no distension. There is no tenderness.  Musculoskeletal: Normal range of motion.  Neurological: She is alert. She has intact cranial nerves. No cranial nerve deficit.  Unable to assess well as she is intubated  Skin: Skin is warm and dry. No rash noted.  Psychiatric: Mood and affect normal.  Was agitated earlier when tried on SBT      LABORATORY PANEL:   CBC  Recent Labs Lab 11/03/14 0447 11/03/14 2013  WBC 21.0*  --   HGB 7.3* 7.6*  HCT 22.6*  --   PLT 203  --    ------------------------------------------------------------------------------------------------------------------  Chemistries   Recent Labs Lab 10/31/14 1358  11/03/14 0447  NA  128*  < > 140  K 4.0  < > 3.9  CL 93*  < > 107  CO2 27  < > 27  GLUCOSE 113*  < > 133*  BUN 22*  < > 14  CREATININE 0.90  < > 0.66  CALCIUM 8.6*  < > 7.3*  AST 19  --   --   ALT 12*  --   --   ALKPHOS 45  --   --   BILITOT 0.2*  --   --   < > = values in this interval not displayed. ------------------------------------------------------------------------------------------------------------------  Cardiac Enzymes  Recent Labs Lab 10/31/14 1358  TROPONINI <0.03   ------------------------------------------------------------------------------------------------------------------  RADIOLOGY:  Dg Abd 1 View  11/03/2014   CLINICAL DATA:  Evaluate orogastric tube placement.  EXAM: ABDOMEN - 1 VIEW  COMPARISON:  CT, 10/31/2014.  FINDINGS: Orogastric tube passes below the diaphragm well into the stomach, well positioned.  Surgical clips in left upper quadrant noted with surgical drain that also projects in left upper quadrant.  Bowel gas pattern is unremarkable.  IMPRESSION: Orogastric tube is well positioned, passing well within the stomach.   Electronically Signed   By: Lajean Manes M.D.   On: 11/03/2014 13:27   Dg Chest Port 1 View  11/04/2014   CLINICAL DATA:  Acute respiratory failure. On ventilator. 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  EXAM: PORTABLE CHEST - 1 VIEW  COMPARISON:  10/31/2014  FINDINGS: Endotracheal tube and nasogastric tube are seen in appropriate position. Surgical drain and skin staples are seen in  the left upper quadrant of the abdomen. No pneumothorax visualized. Left greater than right basilar atelectasis and probable small left pleural effusion are increased since previous study. Heart size remains normal.  IMPRESSION: Increased bibasilar atelectasis and probable small left pleural effusion. No pneumothorax visualized.   Electronically Signed   By: Earle Gell M.D.   On: 11/04/2014 08:45     ASSESSMENT AND PLAN:   54 year old female with past medical  history of COPD, depression, hyperlipidemia, chronic pain, urinary incontinence, who presented to the hospital after a fall and noted to have multiple left rib fractures, possible splenic laceration and also noted to be hypoxic.  #1 acute respiratory failure with hypoxia-pt. Now intubated post-operatively.  - still no evidence of COPD Exacerbation.  - Pulm. for vent management and cont. Care as per them. - Failed spontaneous breathing trial today -  on 60% FiO2.   #2 acute blood loss anemia-this is likely secondary to the spleen laceration. -Patient's hemoglobin had fallen from 9.8-6 on 5/28-5/29. Transfused a total of 5 units since yesterday and Hg. Still 7.6 from yesterday - s/p Splenectomy POD # 2. Follow serial Hg. - no labs today  #3 status post fall and multiple rib fractures on the left/splenic laceration - s/p Splenectomy POD # 2. Cont. Pain control & Care as per Gen. Surgery.   #4 hyperlipidemia continue simvastatin  #5 tobacco abuse-continue nicotine patch.  #6 depression-continue Effexor.  #7 urinary incontinence-continue oxybutynin.  #8 hyponatremia-resolved w/ IV fluids.    All the records are reviewed and case discussed with Care Management/Social Workerr. Management plans discussed with the patient, family and they are in agreement.  CODE STATUS: Full code  TOTAL TIME TAKING CARE OF THIS PATIENT( critical) : 35 minutes.   Keep in ICU now.   [facility], Arbutus Nelligan M.D on 11/04/2014 at 3:28 PM  Between 7am to 6pm - Pager - [phone]  After 6pm go to [email or web address] - password [email or web address] [facility]  Office  [phone]  CC: [facility]

## 2014-11-04 NOTE — Care Management Note (Signed)
Case Management Note  Patient Details  Name: Kelli Marks MRN: [id number] Date of Birth: [birth year]  Subjective/Objective:    Failed spontaneous vent trial. Will re attempt tomorrow. Pt remains on fentanyl and propofol gtt. Unable to speak with pt at this time. It is reported in progression that spouse is prohibited from visiting. No further information given or noted.                 Action/Plan:   Expected Discharge Date:                  Expected Discharge Plan:     In-House Referral:     Discharge planning Services     Post Acute Care Choice:    Choice offered to:     DME Arranged:    DME Agency:     HH Arranged:    HH Agency:     Status of Service:  In process, will continue to follow  Medicare Important Message Given:    Date Medicare IM Given:    Medicare IM give by:    Date Additional Medicare IM Given:    Additional Medicare Important Message give by:     If discussed at [place] of Stay Meetings, dates discussed:    Additional Comments:  Jolly Mango, RN 11/04/2014, 2:00 PM

## 2014-11-04 NOTE — Anesthesia Postprocedure Evaluation (Signed)
  Anesthesia Post-op Note  Patient: Kelli Marks  Procedure(s) Performed: Procedure(s): SPLENECTOMY (N/A)  Anesthesia type:General ETT  Patient location: ICU  Post pain: Pain level controlled  Post assessment: Post-op Vital signs reviewed, Patient's Cardiovascular Status Stable, Respiratory Function Stable, Patent Airway and No signs of Nausea or vomiting  Post vital signs: Reviewed and stable  Last Vitals:  Filed Vitals:   11/04/14 0600  BP: 117/53  Pulse: 87  Temp:   Resp: 15    Level of consciousness: Pt sedated, ventilated, vital signs stable.  Complications: No apparent anesthesia complications

## 2014-11-04 NOTE — Progress Notes (Signed)
2 Days Post-Op   Subjective: Still on [place] but arousable. Failed her spontaneous breathing trial this a.m. No abdominal complaints  Vital signs in last 24 hours: Temp:  [97.6 F (36.4 C)-100.3 F (37.9 C)] 98.9 F (37.2 C) (05/31 0800) Pulse Rate:  [42-131] 116 (05/31 1050) Resp:  [15-31] 18 (05/31 1050) BP: (97-130)/(51-82) 107/60 mmHg (05/31 1000) SpO2:  [81 %-100 %] 94 % (05/31 1050) FiO2 (%):  [35 %-100 %] 100 % (05/31 1032) Last BM Date: 10/30/14  Intake/Output from previous day: 05/30 0701 - 05/31 0700 In: 460.5 [I.V.:210.5; IV Piggyback:250] Out: 6144 [Urine:1000; Drains:210]  GI: Abdomen is soft nondistended. The wound looks good with no evidence for infection  Lab Results:  CBC  Recent Labs  11/02/14 1706 11/03/14 0447 11/03/14 2013  WBC 22.7* 21.0*  --   HGB 7.5* 7.3* 7.6*  HCT 22.6* 22.6*  --   PLT 223 203  --    CMP     Component Value Date/Time   NA 140 11/03/2014 0447   NA 138 02/17/2014 2020   K 3.9 11/03/2014 0447   K 3.9 02/17/2014 2020   CL 107 11/03/2014 0447   CL 105 02/17/2014 2020   CO2 27 11/03/2014 0447   CO2 32 02/17/2014 2020   GLUCOSE 133* 11/03/2014 0447   GLUCOSE 93 02/17/2014 2020   BUN 14 11/03/2014 0447   BUN 4* 02/17/2014 2020   CREATININE 0.66 11/03/2014 0447   CREATININE 0.70 02/17/2014 2020   CALCIUM 7.3* 11/03/2014 0447   CALCIUM 9.2 02/17/2014 2020   PROT 6.3* 10/31/2014 1358   PROT 6.6 02/17/2014 2020   ALBUMIN 3.7 10/31/2014 1358   ALBUMIN 3.6 02/17/2014 2020   AST 19 10/31/2014 1358   AST 12* 02/17/2014 2020   ALT 12* 10/31/2014 1358   ALT 16 02/17/2014 2020   ALKPHOS 45 10/31/2014 1358   ALKPHOS 67 02/17/2014 2020   BILITOT 0.2* 10/31/2014 1358   GFRNONAA >60 11/03/2014 0447   GFRNONAA >60 02/17/2014 2020   GFRAA >60 11/03/2014 0447   GFRAA >60 02/17/2014 2020   PT/INR  Recent Labs  11/03/14 0447  LABPROT 14.1  INR 1.07    Studies/Results: Dg Abd 1 View  11/03/2014   CLINICAL DATA:  Evaluate  orogastric tube placement.  EXAM: ABDOMEN - 1 VIEW  COMPARISON:  CT, 10/31/2014.  FINDINGS: Orogastric tube passes below the diaphragm well into the stomach, well positioned.  Surgical clips in left upper quadrant noted with surgical drain that also projects in left upper quadrant.  Bowel gas pattern is unremarkable.  IMPRESSION: Orogastric tube is well positioned, passing well within the stomach.   Electronically Signed   By: Lajean Manes M.D.   On: 11/03/2014 13:27   Dg Chest Port 1 View  11/04/2014   CLINICAL DATA:  Acute respiratory failure. On ventilator. 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  EXAM: PORTABLE CHEST - 1 VIEW  COMPARISON:  10/31/2014  FINDINGS: Endotracheal tube and nasogastric tube are seen in appropriate position. Surgical drain and skin staples are seen in the left upper quadrant of the abdomen. No pneumothorax visualized. Left greater than right basilar atelectasis and probable small left pleural effusion are increased since previous study. Heart size remains normal.  IMPRESSION: Increased bibasilar atelectasis and probable small left pleural effusion. No pneumothorax visualized.   Electronically Signed   By: Earle Gell M.D.   On: 11/04/2014 08:45    Assessment/Plan: She is doing well overall from standpoint of her splenectomy. Her  hemoglobin remained stable. The major problem again is for respiratory insufficiency. We appreciate the internal medicine and pulmonary physician assistance in this difficult problem. With her hemoglobin at 7.3 we would not recommend any further transfusion at this point.

## 2014-11-04 NOTE — Progress Notes (Signed)
Nutrition Follow-up  DOCUMENTATION CODES:     INTERVENTION:   (Enteral Nutrition:) Dr Mortimer Fries wanting to start enteral nutrition. Recommend vital high protein at 82ml/hr, goal rate. Will provide 1560 kcals and 136 g protein (meeting 100% kcal and protein needs). Recommend starting at 55ml/hr and checking Mg and Phos in am  NUTRITION DIAGNOSIS:  Inadequate oral intake related to acute illness as evidenced by NPO status.    GOAL:  Provide needs based on ASPEN/SCCM guidelines    MONITOR:   (Pulmonary profile, Electrolyte and renal profile, Energy intake)  REASON FOR ASSESSMENT:  Ventilator    ASSESSMENT:  Pt unable to wean from vent this am  Electrolyte and Renal Profile:    Recent Labs Lab 11/01/14 0550 11/02/14 1709 11/03/14 0447  BUN 13 20 14   CREATININE 0.60 0.78 0.66  NA 138 137 140  K 4.3 3.8 3.9   Medications: fentanyl  Height:  Ht Readings from Last 1 Encounters:  10/31/14 5\' 7"  (1.702 m)    Weight:  Wt Readings from Last 1 Encounters:  11/03/14 156 lb 4.9 oz (70.9 kg)        Wt Readings from Last 10 Encounters:  11/03/14 156 lb 4.9 oz (70.9 kg)  10/29/14 145 lb (65.772 kg)    BMI:  Body mass index is 24.48 kg/(m^2).  Estimated Nutritional Needs:  Kcal:  TEE 1582 kcals/d (Ve9.1, tmax 37.3)  Protein:  (1.2-2.0 g/d) 85-142 g/d (Using 71kg)  Fluid:  (25-51ml/kg) 1775-2137ml/d  Skin:  Reviewed, no issues  Diet Order:     EDUCATION NEEDS:  No education needs identified at this time   Last BM:  5/26  HIGH Care Level Tishanna Dunford B. Zenia Resides, [place], [place] (pager)

## 2014-11-05 DIAGNOSIS — J9600 Acute respiratory failure, unspecified whether with hypoxia or hypercapnia: Secondary | ICD-10-CM

## 2014-11-05 LAB — BASIC METABOLIC PANEL
Anion gap: 5 (ref 5–15)
BUN: 9 mg/dL (ref 6–20)
CHLORIDE: 103 mmol/L (ref 101–111)
CO2: 32 mmol/L (ref 22–32)
Calcium: 8 mg/dL — ABNORMAL LOW (ref 8.9–10.3)
Creatinine, Ser: 0.55 mg/dL (ref 0.44–1.00)
GFR calc Af Amer: >60 mL/min (ref >60)
GFR calc non Af Amer: >60 mL/min (ref >60)
GLUCOSE: 106 mg/dL — AB (ref 65–99)
POTASSIUM: 3.1 mmol/L — AB (ref 3.5–5.1)
SODIUM: 140 mmol/L (ref 135–145)

## 2014-11-05 LAB — CBC
HCT: 21.7 % — ABNORMAL LOW (ref 35.0–47.0)
Hemoglobin: 7.2 g/dL — ABNORMAL LOW (ref 12.0–16.0)
MCH: 29.6 pg (ref 26.0–34.0)
MCHC: 33.1 g/dL (ref 32.0–36.0)
MCV: 89.4 fL (ref 80.0–100.0)
Platelets: 313 10*3/uL (ref 150–440)
RBC: 2.43 MIL/uL — ABNORMAL LOW (ref 3.80–5.20)
RDW: 15.8 % — AB (ref 11.5–14.5)
WBC: 17.6 10*3/uL — ABNORMAL HIGH (ref 3.6–11.0)

## 2014-11-05 LAB — GLUCOSE, CAPILLARY
GLUCOSE-CAPILLARY: 109 mg/dL — AB (ref 65–99)
Glucose-Capillary: 103 mg/dL — ABNORMAL HIGH (ref 65–99)
Glucose-Capillary: 115 mg/dL — ABNORMAL HIGH (ref 65–99)
Glucose-Capillary: 116 mg/dL — ABNORMAL HIGH (ref 65–99)

## 2014-11-05 LAB — PHOSPHORUS: PHOSPHORUS: 3 mg/dL (ref 2.5–4.6)

## 2014-11-05 LAB — MAGNESIUM: MAGNESIUM: 2 mg/dL (ref 1.7–2.4)

## 2014-11-05 LAB — SURGICAL PATHOLOGY

## 2014-11-05 LAB — POTASSIUM: Potassium: 3.6 mmol/L (ref 3.5–5.1)

## 2014-11-05 MED ORDER — POTASSIUM CHLORIDE 2 MEQ/ML IV SOLN
Freq: Once | INTRAVENOUS | Status: AC
  Administered 2014-11-05: 10:00:00 via INTRAVENOUS
  Filled 2014-11-05: qty 20

## 2014-11-05 MED ORDER — DOCUSATE SODIUM 50 MG/5ML PO LIQD
100.0000 mg | Freq: Two times a day (BID) | ORAL | Status: DC
  Administered 2014-11-05 – 2014-11-06 (×2): 100 mg via ORAL
  Filled 2014-11-05 (×2): qty 10

## 2014-11-05 MED ORDER — BUTAMBEN-TETRACAINE-BENZOCAINE 2-2-14 % EX AERO
1.0000 | INHALATION_SPRAY | CUTANEOUS | Status: DC | PRN
  Filled 2014-11-05: qty 20

## 2014-11-05 MED ORDER — ACETAMINOPHEN 325 MG PO TABS: 650.0000 mg | ORAL_TABLET | Freq: Four times a day (QID) | ORAL | Status: DC | PRN

## 2014-11-05 MED ORDER — OXYCODONE HCL 5 MG PO TABS
5.0000 mg | ORAL_TABLET | Freq: Four times a day (QID) | ORAL | Status: DC | PRN
Start: 2014-11-05 — End: 2014-11-09
  Administered 2014-11-05 – 2014-11-09 (×3): 5 mg via ORAL
  Filled 2014-11-05 (×3): qty 1

## 2014-11-05 NOTE — Progress Notes (Signed)
Surgery  POD 3  S/P splenectomy  Undergoing a spontaneous breathing trial Pain seems well controlled.  Filed Vitals:   11/05/14 0553 11/05/14 0600 11/05/14 0700 11/05/14 0800  BP:  106/56 115/57 118/58  Pulse:   93 92  Temp:      TempSrc:      Resp:  16 16 16   Height:      Weight: 72.1 kg (158 lb 15.2 oz)     SpO2:   95% 94%    XH:FSFSE, follows commands [place] looks clean   Labs: CBC Latest Ref Rng 11/05/2014 11/04/2014 11/03/2014  WBC 3.6 - 11.0 K/uL 17.6(H) 21.0(H) -  Hemoglobin 12.0 - 16.0 g/dL 7.2(L) 7.7(L) 7.6(L)  Hematocrit 35.0 - 47.0 % 21.7(L) 23.8(L) -  Platelets 150 - 440 K/uL 313 265 -    BMP Latest Ref Rng 11/05/2014 11/04/2014 11/03/2014  Glucose 65 - 99 mg/dL 106(H) 86 133(H)  BUN 6 - 20 mg/dL 9 8 14   Creatinine 0.44 - 1.00 mg/dL 0.55 0.49 0.66  Sodium 135 - 145 mmol/L 140 141 140  Potassium 3.5 - 5.1 mmol/L 3.1(L) 3.3(L) 3.9  Chloride 101 - 111 mmol/L 103 103 107  CO2 22 - 32 mmol/L 32 29 27  Calcium 8.9 - 10.3 mg/dL 8.0(L) 7.8(L) 7.3(L)      IMP:  Stable and plan to extubate this am Will start back IVF as ogt will be removed with extubation.  Plan: Extubate, pain control. Mobilize. Will hold on further transfusions.

## 2014-11-05 NOTE — Progress Notes (Signed)
[facility] [place] NAME: Kelli Marks    MR#:  [id number]  DATE OF BIRTH:  1960/10/23  SUBJECTIVE:  CHIEF COMPLAINT:   Chief Complaint  Patient presents with  . Rib Injury  . Fall   denies any new symptoms at this time  REVIEW OF SYSTEMS:  Review of Systems  Constitutional: Negative for fever, weight loss, malaise/fatigue and diaphoresis.  HENT: Negative for ear discharge, ear pain, hearing loss, nosebleeds, sore throat and tinnitus.   Eyes: Negative for blurred vision and pain.  Respiratory: Negative for cough, hemoptysis, shortness of breath and wheezing.   Cardiovascular: Negative for chest pain, palpitations, orthopnea and leg swelling.  Gastrointestinal: Negative for heartburn, nausea, vomiting, abdominal pain, diarrhea, constipation and blood in stool.  Genitourinary: Negative for dysuria, urgency and frequency.  Musculoskeletal: Negative for myalgias and back pain.  Skin: Negative for itching and rash.  Neurological: Negative for dizziness, tingling, tremors, focal weakness, seizures, weakness and headaches.  Psychiatric/Behavioral: Negative for depression. The patient is not nervous/anxious.     DRUG ALLERGIES:  No Known Allergies  VITALS:  Blood pressure 115/61, pulse 96, temperature 98.7 F (37.1 C), temperature source Oral, resp. rate 21, height 5\' 7"  (1.702 m), weight 72.1 kg (158 lb 15.2 oz), SpO2 94 %.  PHYSICAL EXAMINATION:  Physical Exam  Constitutional: She is well-developed, well-nourished, and in no distress. She is not intubated.  HENT:  Head: Normocephalic and atraumatic.  Eyes: Conjunctivae and EOM are normal. Pupils are equal, round, and reactive to light.  Neck: Normal range of motion. Neck supple. No tracheal deviation present. No thyromegaly present.  Cardiovascular: Normal rate, regular rhythm and normal heart sounds.   Pulmonary/Chest: Effort normal. She is not intubated. No respiratory  distress. She has decreased breath sounds in the right lower field. She has no wheezes. She has rhonchi. She has rales. She exhibits no tenderness.  Abdominal: Soft. Bowel sounds are normal. She exhibits no distension. There is no tenderness.  Musculoskeletal: Normal range of motion.  Neurological: She is alert. She has intact cranial nerves. No cranial nerve deficit.  Unable to assess well as she is intubated  Skin: Skin is warm and dry. No rash noted.  Psychiatric: Mood and affect normal.    LABORATORY PANEL:   CBC  Recent Labs Lab 11/05/14 0639  WBC 17.6*  HGB 7.2*  HCT 21.7*  PLT 313   ------------------------------------------------------------------------------------------------------------------  Chemistries   Recent Labs Lab 10/31/14 1358  11/05/14 0639 11/05/14 1511  NA 128*  < > 140  --   K 4.0  < > 3.1* 3.6  CL 93*  < > 103  --   CO2 27  < > 32  --   GLUCOSE 113*  < > 106*  --   BUN 22*  < > 9  --   CREATININE 0.90  < > 0.55  --   CALCIUM 8.6*  < > 8.0*  --   MG  --   < > 2.0  --   AST 19  --   --   --   ALT 12*  --   --   --   ALKPHOS 45  --   --   --   BILITOT 0.2*  --   --   --   < > = values in this interval not displayed. ------------------------------------------------------------------------------------------------------------------  RADIOLOGY:  Dg Chest Port 1 View  11/04/2014   CLINICAL DATA:  Acute respiratory failure.  On ventilator. 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  EXAM: PORTABLE CHEST - 1 VIEW  COMPARISON:  10/31/2014  FINDINGS: Endotracheal tube and nasogastric tube are seen in appropriate position. Surgical drain and skin staples are seen in the left upper quadrant of the abdomen. No pneumothorax visualized. Left greater than right basilar atelectasis and probable small left pleural effusion are increased since previous study. Heart size remains normal.  IMPRESSION: Increased bibasilar atelectasis and probable small left pleural  effusion. No pneumothorax visualized.   Electronically Signed   By: Earle Gell M.D.   On: 11/04/2014 08:45   ASSESSMENT AND PLAN:   54 year old female with past medical history of COPD, depression, hyperlipidemia, chronic pain, urinary incontinence, who presented to the hospital after a fall and noted to have multiple left rib fractures, possible splenic laceration and also noted to be hypoxic.  #1 acute respiratory failure with hypoxia- resolved. Now extubated and on nasal cannula. - still no evidence of COPD Exacerbation.    #2 acute blood loss anemia-this is likely secondary to the spleen laceration. -Patient's hemoglobin had fallen from 9.8-6 on 5/28-5/29. Transfused a total of 5 units since yesterday and Hg. Still 7.2 today. Will order 1 more PRBC. - s/p Splenectomy POD # 3. Follow serial Hg.   #3 status post fall and multiple rib fractures on the left/splenic laceration - s/p Splenectomy POD # 3. Cont. Pain control & Care as per Gen. Surgery.   #4 hyperlipidemia continue simvastatin  #5 tobacco abuse-continue nicotine patch.  #6 depression-continue Effexor.  #7 urinary incontinence-continue oxybutynin.  #8 hyponatremia-resolved w/ IV fluids.    All the records are reviewed and case discussed with Care Management/Social Workerr. Management plans discussed with the patient, family and they are in agreement.  CODE STATUS: Full code  TOTAL TIME TAKING CARE OF THIS PATIENT : 35 minutes.   Can change to step-down.   [facility], Dorota Heinrichs M.D on 11/05/2014 at 4:17 PM  Between 7am to 6pm - Pager - [phone]  After 6pm go to [email or web address] - password [email or web address] [place] [facility]  Office  [phone]  CC: [facility]

## 2014-11-05 NOTE — Progress Notes (Signed)
Spoke with Dr. Mortimer Fries, MD ordered Nurse Swallow Screen and to order clear liquid and advance diet as tolerated if patient passes. Patient passed swallow evaluation. Also spoke about scheduled pain medications, MD stated to give midday dose if patient in pain and will reassess in afternoon rounds pain medication status.

## 2014-11-05 NOTE — Progress Notes (Signed)
Nutrition Follow-up  DOCUMENTATION CODES:     INTERVENTION:   (Coordination of care: ) If unable to pass bedside swallow evaluation recommend SLP consult prior to starting diet.  NUTRITION DIAGNOSIS:  Inadequate oral intake related to acute illness as evidenced by NPO status    GOAL:  Patient will meet greater than or equal to 90% of their needs    MONITOR:   (Energy intake, Electrolyte and renal profile, )  REASON FOR ASSESSMENT:  Ventilator    ASSESSMENT:  Pt extubated this am to 4 liters nasal canula.  Electrolyte and Renal Profile:  Recent Labs Lab 11/03/14 0447 11/04/14 1651 11/05/14 0639  BUN 14 8 9   CREATININE 0.66 0.49 0.55  NA 140 141 140  K 3.9 3.3* 3.1*  MG  --  2.1 2.0  PHOS  --  3.1 3.0   Glucose Profile:   Recent Labs  11/05/14 0413 11/05/14 0744 11/05/14 1242  GLUCAP 109* 115* 116*     Height:  Ht Readings from Last 1 Encounters:  10/31/14 5\' 7"  (1.702 m)    Weight:  Wt Readings from Last 1 Encounters:  11/05/14 158 lb 15.2 oz (72.1 kg)        Wt Readings from Last 10 Encounters:  11/05/14 158 lb 15.2 oz (72.1 kg)  10/29/14 145 lb (65.772 kg)    BMI:  Body mass index is 24.89 kg/(m^2).  Estimated Nutritional Needs:  Kcal:  BEE 1347 kcals (IF 1.0-1.3, AF 1.2) 1751-2101 kcals/d  Protein:  (1.2-1.5 g/d) 86-108 g/d  Fluid:  (25-40ml/kg) 1800-2132ml/d  Skin:  Reviewed, no issues  Diet Order:  Diet clear liquid Room service appropriate?: Yes; Fluid consistency:: Thin  EDUCATION NEEDS:  No education needs identified at this time   Intake/Output Summary (Last 24 hours) at 11/05/14 1404 Last data filed at 11/05/14 1200  Gross per 24 hour  Intake 670.29 ml  Output    725 ml  Net -54.71 ml    Last BM:  5/26 MODERATE Care Level Nkechi Linehan B. Zenia Resides, [place], [place] (pager)

## 2014-11-05 NOTE — Progress Notes (Signed)
[facility] at [place] NAME: Kelli Marks    MR#:  [id number]  DATE OF BIRTH:  [birth year]  SUBJECTIVE:  CHIEF COMPLAINT:   Chief Complaint  Patient presents with  . Rib Injury  . Fall   S/p Splenectomy POD # 1.  3 lites of blood/clot removed from abdominal cavity as per surgical note.  Hemodynamically stable.  Hg. Stable.  Off vasopressor.  Currently intubated & sedated. Plan for SAT/SBT      REVIEW OF SYSTEMS:    Review of Systems  Unable to perform ROS: critical illness  as pt. Is sedated & intubated.   Nutrition: NPO as pt. Is sedated & intubated.  Tolerating Diet: No   DRUG ALLERGIES:  No Known Allergies  VITALS:  Blood pressure 103/53, pulse 92, temperature 98.9 F (37.2 C), temperature source Oral, resp. rate 16, height 5\' 7"  (1.702 m), weight 158 lb 15.2 oz (72.1 kg), SpO2 89 %.  PHYSICAL EXAMINATION:   Physical Exam  Constitutional: She appears distressed.  HENT:  Head: Normocephalic and atraumatic.  Eyes: Pupils are equal, round, and reactive to light. No scleral icterus.  Neck: Normal range of motion. Neck supple.  Cardiovascular: Normal rate and regular rhythm.   No murmur heard. Pulmonary/Chest: She is in respiratory distress. She has no wheezes. She has rales.  resp distress  Abdominal: Soft. She exhibits no distension. There is no tenderness.  Musculoskeletal: She exhibits no edema.  Neurological: She displays normal reflexes. Coordination normal.  gcs<8T  Skin: Skin is warm. No rash noted. She is diaphoretic.    Physical Exam  Constitutional: She appears distressed.  HENT:  Head: Normocephalic and atraumatic.  Eyes: Pupils are equal, round, and reactive to light. No scleral icterus.  Neck: Normal range of motion. Neck supple.  Cardiovascular: Normal rate and regular rhythm.   No murmur heard. Pulmonary/Chest: She is in respiratory distress. She has no wheezes. She has rales.  resp  distress  Abdominal: Soft. She exhibits no distension. There is no tenderness.  Musculoskeletal: She exhibits no edema.  Neurological: She displays normal reflexes. Coordination normal.  gcs<8T  Skin: Skin is warm. No rash noted. She is diaphoretic.      LABORATORY PANEL:   CBC  Recent Labs Lab 11/05/14 0639  WBC 17.6*  HGB 7.2*  HCT 21.7*  PLT 313   ------------------------------------------------------------------------------------------------------------------  Chemistries   Recent Labs Lab 10/31/14 1358  11/05/14 0639  NA 128*  < > 140  K 4.0  < > 3.1*  CL 93*  < > 103  CO2 27  < > 32  GLUCOSE 113*  < > 106*  BUN 22*  < > 9  CREATININE 0.90  < > 0.55  CALCIUM 8.6*  < > 8.0*  MG  --   < > 2.0  AST 19  --   --   ALT 12*  --   --   ALKPHOS 45  --   --   BILITOT 0.2*  --   --   < > = values in this interval not displayed. ------------------------------------------------------------------------------------------------------------------  Cardiac Enzymes  Recent Labs Lab 10/31/14 1358  TROPONINI <0.03   ------------------------------------------------------------------------------------------------------------------  RADIOLOGY:  Dg Abd 1 View  11/03/2014   CLINICAL DATA:  Evaluate orogastric tube placement.  EXAM: ABDOMEN - 1 VIEW  COMPARISON:  CT, 10/31/2014.  FINDINGS: Orogastric tube passes below the diaphragm well into the stomach, well positioned.  Surgical clips in left upper quadrant noted  with surgical drain that also projects in left upper quadrant.  Bowel gas pattern is unremarkable.  IMPRESSION: Orogastric tube is well positioned, passing well within the stomach.   Electronically Signed   By: Lajean Manes M.D.   On: 11/03/2014 13:27   Dg Chest Port 1 View  11/04/2014   CLINICAL DATA:  Acute respiratory failure. On ventilator. 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  EXAM: PORTABLE CHEST - 1 VIEW  COMPARISON:  10/31/2014  FINDINGS:  Endotracheal tube and nasogastric tube are seen in appropriate position. Surgical drain and skin staples are seen in the left upper quadrant of the abdomen. No pneumothorax visualized. Left greater than right basilar atelectasis and probable small left pleural effusion are increased since previous study. Heart size remains normal.  IMPRESSION: Increased bibasilar atelectasis and probable small left pleural effusion. No pneumothorax visualized.   Electronically Signed   By: Earle Gell M.D.   On: 11/04/2014 08:45     ASSESSMENT AND PLAN:   54 year old female with past medical history of COPD, depression, hyperlipidemia, chronic pain, urinary incontinence, who presented to the hospital after a s/p  fall and noted to have multiple left rib fractures, s/p splenic laceration s/p splenectomy. Transferred to ICU for post op resp hypoxia with left sided effusion/with possible contusion  1.Respiratory Failure-plan for SAT/SBt  Today and assess for extubation -continue Bronchodilator Therapy -Wean Fio2 and PEEP as tolerated  2.Left sided effusion/opacity-possible contusion -will need to consider CT chest   I have personally obtained a history, examined the patient, evaluated Pertinent laboratory and [employer]/imaging results, and  formulated the assessment and plan   The Patient requires high complexity decision making for assessment and support, frequent evaluation and titration of therapies, application of advanced monitoring technologies and extensive interpretation of multiple databases. Critical Care Time devoted to patient care services described in this note is 35 mins.   Overall, patient is critically ill, prognosis is guarded.    Corrin Parker, M.D. Pulmonary & [place] Director Intensive Care Unit

## 2014-11-06 ENCOUNTER — Encounter: Payer: Self-pay | Admitting: Surgery

## 2014-11-06 LAB — BASIC METABOLIC PANEL
Anion gap: 9 (ref 5–15)
BUN: 8 mg/dL (ref 6–20)
CHLORIDE: 98 mmol/L — AB (ref 101–111)
CO2: 28 mmol/L (ref 22–32)
CREATININE: 0.57 mg/dL (ref 0.44–1.00)
Calcium: 8.2 mg/dL — ABNORMAL LOW (ref 8.9–10.3)
GFR calc Af Amer: >60 mL/min (ref >60)
GFR calc non Af Amer: >60 mL/min (ref >60)
GLUCOSE: 88 mg/dL (ref 65–99)
Potassium: 3.6 mmol/L (ref 3.5–5.1)
SODIUM: 135 mmol/L (ref 135–145)

## 2014-11-06 LAB — CBC
HEMATOCRIT: 23.6 % — AB (ref 35.0–47.0)
HEMOGLOBIN: 7.9 g/dL — AB (ref 12.0–16.0)
MCH: 29.3 pg (ref 26.0–34.0)
MCHC: 33.2 g/dL (ref 32.0–36.0)
MCV: 88.2 fL (ref 80.0–100.0)
PLATELETS: 419 10*3/uL (ref 150–440)
RBC: 2.68 MIL/uL — ABNORMAL LOW (ref 3.80–5.20)
RDW: 15 % — AB (ref 11.5–14.5)
WBC: 20.5 10*3/uL — ABNORMAL HIGH (ref 3.6–11.0)

## 2014-11-06 LAB — MAGNESIUM: MAGNESIUM: 2 mg/dL (ref 1.7–2.4)

## 2014-11-06 LAB — GLUCOSE, CAPILLARY: Glucose-Capillary: 82 mg/dL (ref 65–99)

## 2014-11-06 LAB — PHOSPHORUS: Phosphorus: 3.5 mg/dL (ref 2.5–4.6)

## 2014-11-06 MED ORDER — SENNA 8.6 MG PO TABS
2.0000 | ORAL_TABLET | Freq: Two times a day (BID) | ORAL | Status: DC
  Administered 2014-11-06 – 2014-11-08 (×5): 17.2 mg via ORAL
  Filled 2014-11-06 (×5): qty 2

## 2014-11-06 MED ORDER — DOCUSATE SODIUM 100 MG PO CAPS
100.0000 mg | ORAL_CAPSULE | Freq: Two times a day (BID) | ORAL | Status: DC
  Administered 2014-11-06 – 2014-11-09 (×6): 100 mg via ORAL
  Filled 2014-11-06 (×6): qty 1

## 2014-11-06 NOTE — Progress Notes (Signed)
[place] at [place] NAME: Kelli Marks    MR#:  [id number]  DATE OF BIRTH:  1961-03-03  SUBJECTIVE:  Patient feels okay. Not that much pain at all. No complaints of shortness of breath.  REVIEW OF SYSTEMS:  Review of Systems  Constitutional: Negative for fever and chills.  Eyes: Negative for blurred vision.  Respiratory: Negative for cough and shortness of breath.   Cardiovascular: Negative for chest pain.  Gastrointestinal: Positive for abdominal pain and constipation. Negative for nausea, vomiting and diarrhea.  Genitourinary: Negative for dysuria.  Musculoskeletal: Negative for joint pain.  Neurological: Negative for dizziness and headaches.    DRUG ALLERGIES:  No Known Allergies  VITALS:  Blood pressure 113/68, pulse 89, temperature 99 F (37.2 C), temperature source Oral, resp. rate 20, height 5\' 7"  (1.702 m), weight 72.1 kg (158 lb 15.2 oz), SpO2 95 %.  PHYSICAL EXAMINATION:  Physical Exam  Constitutional: She is well-developed, well-nourished, and in no distress. She is not intubated.  HENT:  Head: Normocephalic and atraumatic.  Eyes: Conjunctivae and EOM are normal. Pupils are equal, round, and reactive to light.  Neck: Normal range of motion. Neck supple. No tracheal deviation present. No thyromegaly present.  Cardiovascular: Normal rate, regular rhythm and normal heart sounds.   Pulmonary/Chest: Effort normal. She is not intubated. No respiratory distress. She has decreased breath sounds in the right lower field and the left lower field. She has no wheezes. She has no rhonchi. She has no rales. She exhibits no tenderness.  Abdominal: Soft. She exhibits distension. Bowel sounds are hypoactive. There is generalized tenderness.  Musculoskeletal: Normal range of motion.  Lymphadenopathy:    She has no cervical adenopathy.  Neurological: She is alert. A cranial nerve deficit is present.  Skin: Skin is warm and dry.   Hematoma left lower abdomen.  Psychiatric: Mood and affect normal.    LABORATORY PANEL:   CBC  Recent Labs Lab 11/06/14 0651  WBC 20.5*  HGB 7.9*  HCT 23.6*  PLT 419   Chemistries   Recent Labs Lab 10/31/14 1358  11/06/14 0625  NA 128*  < > 135  K 4.0  < > 3.6  CL 93*  < > 98*  CO2 27  < > 28  GLUCOSE 113*  < > 88  BUN 22*  < > 8  CREATININE 0.90  < > 0.57  CALCIUM 8.6*  < > 8.2*  MG  --   < > 2.0  AST 19  --   --   ALT 12*  --   --   ALKPHOS 45  --   --   BILITOT 0.2*  --   --   < > = values in this interval not displayed.   ASSESSMENT AND PLAN:   1. acute respiratory failure with hypoxia- I turned the patient's nasal cannula down to 3 L from 6 L. Patient is status post extubation yesterday. Patient can likely go out of the ICU.   2.  acute blood loss anemia -secondary to the spleen laceration. -Patient transfused a total of 6 units of blood and hemoglobin today is 7.9. Hold off on IV fluid hydration at this point since patient is on diet.  3. status post fall and multiple rib fractures on the left/splenic laceration   4. hyperlipidemia continue simvastatin  5. tobacco abuse-continue nicotine patch.  6.  depression-continue Effexor.  7. urinary incontinence-continue oxybutynin.  8. hyponatremia-improved  All the records  are reviewed and case discussed with Care Management/Social Workerr. Management plans discussed with the patient, and she is in agreement.  CODE STATUS: Full code  TOTAL TIME TAKING CARE OF THIS PATIENT : 20 minutes minutes.    Loletha Grayer M.D on 11/06/2014 at 2:46 PM  Between 7am to 6pm - Pager - [phone]  After 6pm go to [email or web address] - [facility]  Office  [phone]

## 2014-11-06 NOTE — Evaluation (Signed)
Physical Therapy Evaluation Patient Details Name: Kelli Marks MRN: [id number] DOB: 1960/11/05 Today's Date: 11/06/2014   History of Present Illness  Pt admitted for splenic laceration. Pt orginally fell out of bed, came to ED and was discharged. Pt then returned to ED with complaints of dizziness, SOB, and rib fratures (7th and 8th). 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 Pt on ventilator from 5/29-6/1. Pt with history of COPD, ARF, anemia, and hyponatremia.  Clinical Impression  Pt is a pleasant 54 year old female who was admitted for splenic laceration. Pt performs bed mobility/transfers with mod assist +2 using rw and ambulation with min assist and rw. All mobility performed on 4L of O2 with sats decreasing to 79% with mobility however quickly improves to 89% with resting. Pt demonstrates deficits with strength/mobility/endurance. Would benefit from skilled PT to address above deficits and promote optimal return to PLOF. Recommend transition to STR upon discharge from acute hospitalization.       Follow Up Recommendations SNF    Equipment Recommendations  Rolling walker with 5" wheels    Recommendations for Other Services       Precautions / Restrictions Precautions Precautions: None Restrictions Weight Bearing Restrictions: No      Mobility  Bed Mobility Overal bed mobility: Needs Assistance Bed Mobility: Supine to Sit     Supine to sit: Mod assist     General bed mobility comments: supine->sit with mod assist. Cues given for scooting out towards EOB.  Transfers Overall transfer level: Needs assistance Equipment used: Rolling walker (2 wheeled) Transfers: Sit to/from Stand           General transfer comment: sit<>Stand with mod assist + 2 and rw. All mobility performed on 4L of O2.  Ambulation/Gait Ambulation/Gait assistance: Min assist Ambulation Distance (Feet): 5 Feet Assistive device: Rolling walker (2  wheeled)       General Gait Details: ambulated using rw and min assist. O2 sats decrease to 79% with ambulation. Step to gait pattern performed with cues for step to gait pattern.  Stairs            Wheelchair Mobility    Modified Rankin (Stroke Patients Only)       Balance Overall balance assessment: History of Falls;Needs assistance   Sitting balance-Leahy Scale: Normal       Standing balance-Leahy Scale: Fair                               Pertinent Vitals/Pain Pain Assessment: No/denies pain    Home Living Family/patient expects to be discharged to:: Private residence Living Arrangements:  (she reports she has female friends) Available Help at Discharge: Friend(s) Type of Home: House Home Access: Stairs to enter Entrance Stairs-Rails: None Entrance Stairs-Number of Steps: 1 Home Layout: Two level Home Equipment: None      Prior Function Level of Independence: Independent               Hand Dominance        Extremity/Trunk Assessment   Upper Extremity Assessment: Generalized weakness           Lower Extremity Assessment: Generalized weakness         Communication   Communication: No difficulties  Cognition Arousal/Alertness: Awake/alert Behavior During Therapy: WFL for tasks assessed/performed Overall Cognitive Status: Within Functional Limits for tasks assessed  General Comments      Exercises        Assessment/Plan    PT Assessment Patient needs continued PT services  PT Diagnosis Difficulty walking;Generalized weakness   PT Problem List Decreased strength;Decreased activity tolerance;Decreased knowledge of use of DME  PT Treatment Interventions Gait training;DME instruction;Therapeutic exercise   PT Goals (Current goals can be found in the Care Plan section) Acute Rehab PT Goals Patient Stated Goal: to go home PT Goal Formulation: With patient Potential to Achieve Goals:  Good    Frequency Min 2X/week   Barriers to discharge Inaccessible home environment      Co-evaluation               End of Session Equipment Utilized During Treatment: Gait belt;Oxygen Activity Tolerance: Patient tolerated treatment well Patient left: in chair Nurse Communication: Mobility status         Time: 6270-3500 PT Time Calculation (min) (ACUTE ONLY): 28 min   Charges:   PT Evaluation $Initial PT Evaluation Tier I: 1 Procedure     PT G Codes:        Layton Tappan 2014-11-30, 4:42 PM  Greggory Stallion, PT, DPT [phone]

## 2014-11-06 NOTE — Care Management Note (Signed)
Case Management Note  Patient Details  Name: HALAINA VANDUZER MRN: [id number] Date of Birth: [birth year]  Subjective/Objective:   Extubated 06/01. Currently on 4L/[place]. No active bleeding. HGB 7.9 after 6 total units of blood. Met with pt. Explained RNCM role. Patient reports she has [employer]. Her PCP is Dr/ Gayla Medicus in [place]. 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              Action/Plan:   Expected Discharge Date:                  Expected Discharge Plan:     In-House Referral:     Discharge planning Services     Post Acute Care Choice:    Choice offered to:     DME Arranged:    DME Agency:     HH Arranged:    HH Agency:     Status of Service:  In process, will continue to follow  Medicare Important Message Given:    Date Medicare IM Given:    Medicare IM give by:    Date Additional Medicare IM Given:    Additional Medicare Important Message give by:     If discussed at [place] of Stay Meetings, dates discussed:    Additional Comments:  Jolly Mango, RN 11/06/2014, 2:17 PM

## 2014-11-06 NOTE — Progress Notes (Signed)
No changes noted this shift, pt answering appropriately. Pt earlier in shift talking on phone to family. Dressing to left flank noted to have serous sanguineus drainage and dressing saturated. Dressing changed this shift x 2 last at 0545 am. Pt remains on oxygen at 6L via N/C. No other changes noted this shift.

## 2014-11-06 NOTE — Progress Notes (Signed)
4 Days Post-Op   Subjective:  She is lethargic and mildly sedated but arousable and appropriate. She denies any nausea or vomiting. She has been eating small amounts of regular diet. She is not short of breath but remains with an oxygen saturation in the high 80s and low 90s. She complains of some mild abdominal discomfort. She has been up to the chair today.  Vital signs in last 24 hours: Temp:  [99 F (37.2 C)-100.6 F (38.1 C)] 99 F (37.2 C) (06/02 0800) Pulse Rate:  [85-100] 89 (06/02 1100) Resp:  [17-29] 20 (06/02 1100) BP: (93-116)/(48-69) 113/68 mmHg (06/02 1100) SpO2:  [83 %-96 %] 95 % (06/02 1100) Weight:  [72.1 kg (158 lb 15.2 oz)] 72.1 kg (158 lb 15.2 oz) (06/02 0551) Last BM Date: 10/30/14  Intake/Output from previous day: 06/01 0701 - 06/02 0700 In: 500 [IV Piggyback:500] Out: 1115 [Urine:950; Drains:165]  GI: Her abdomen is soft and mildly distended with hypoactive but present bowel sounds. Her drain continues to drain serosanguineous fluid. The wound looks good.  Lab Results:  CBC  Recent Labs  11/05/14 0639 11/06/14 0651  WBC 17.6* 20.5*  HGB 7.2* 7.9*  HCT 21.7* 23.6*  PLT 313 419   CMP     Component Value Date/Time   NA 135 11/06/2014 0625   NA 138 02/17/2014 2020   K 3.6 11/06/2014 0625   K 3.9 02/17/2014 2020   CL 98* 11/06/2014 0625   CL 105 02/17/2014 2020   CO2 28 11/06/2014 0625   CO2 32 02/17/2014 2020   GLUCOSE 88 11/06/2014 0625   GLUCOSE 93 02/17/2014 2020   BUN 8 11/06/2014 0625   BUN 4* 02/17/2014 2020   CREATININE 0.57 11/06/2014 0625   CREATININE 0.70 02/17/2014 2020   CALCIUM 8.2* 11/06/2014 0625   CALCIUM 9.2 02/17/2014 2020   PROT 6.3* 10/31/2014 1358   PROT 6.6 02/17/2014 2020   ALBUMIN 3.7 10/31/2014 1358   ALBUMIN 3.6 02/17/2014 2020   AST 19 10/31/2014 1358   AST 12* 02/17/2014 2020   ALT 12* 10/31/2014 1358   ALT 16 02/17/2014 2020   ALKPHOS 45 10/31/2014 1358   ALKPHOS 67 02/17/2014 2020   BILITOT 0.2*  10/31/2014 1358   GFRNONAA >60 11/06/2014 0625   GFRNONAA >60 02/17/2014 2020   GFRAA >60 11/06/2014 0625   GFRAA >60 02/17/2014 2020   PT/INR No results for input(s): LABPROT, INR in the last 72 hours.  Studies/Results: No results found.  Assessment/Plan: Overall she seems to be improving. We will plan to transfer her to the floor try to increase her activity level over the next 24 hours. Care plan has been discussed with the patient's nurse.

## 2014-11-06 NOTE — Progress Notes (Signed)
Nutrition Follow-up  DOCUMENTATION CODES:     INTERVENTION:   (Medical Nutrition Supplement): Will add mightyshake TID for added nutrition to better meet nutritional needs. RN Brittney advanced diet and solid food late tray coming this pm  NUTRITION DIAGNOSIS:  Inadequate oral intake related to acute illness as evidenced by meal completion < 25%.    GOAL:  Patient will meet greater than or equal to 90% of their needs    MONITOR:   (Energy intake, Electrolyte and renal profile, )  REASON FOR ASSESSMENT:  Ventilator    ASSESSMENT:  Pt extubated yesterday  Pt not eating clear liquids, does not like them. Full lunch tray at bedside.  Pt reports eating well prior to admission  Height:  Ht Readings from Last 1 Encounters:  10/31/14 5\' 7"  (1.702 m)    Weight:  Wt Readings from Last 1 Encounters:  11/06/14 158 lb 15.2 oz (72.1 kg)   Pt reports no weight loss prior to admission  Wt Readings from Last 10 Encounters:  11/06/14 158 lb 15.2 oz (72.1 kg)  10/29/14 145 lb (65.772 kg)    BMI:  Body mass index is 24.89 kg/(m^2).  Estimated Nutritional Needs:  Kcal:  BEE 1347 kcals (IF 1.0-1.3, AF 1.2) 1751-2101 kcals/d  Protein:  (1.2-1.5 g/d) 86-108 g/d  Fluid:  (25-52ml/kg) 1800-2134ml/d  Skin:  Reviewed, no issues  Diet Order:  Diet Carb Modified Fluid consistency:: Thin; Room service appropriate?: Yes  EDUCATION NEEDS:  No education needs identified at this time    Last BM:  5/26  MODERATE Care Level Kelli Marks B. Zenia Resides, [place], [place] (pager)

## 2014-11-07 LAB — BASIC METABOLIC PANEL
ANION GAP: 10 (ref 5–15)
BUN: 9 mg/dL (ref 6–20)
CALCIUM: 8.4 mg/dL — AB (ref 8.9–10.3)
CO2: 29 mmol/L (ref 22–32)
CREATININE: 0.48 mg/dL (ref 0.44–1.00)
Chloride: 97 mmol/L — ABNORMAL LOW (ref 101–111)
GFR calc Af Amer: >60 mL/min (ref >60)
Glucose, Bld: 75 mg/dL (ref 65–99)
POTASSIUM: 3.1 mmol/L — AB (ref 3.5–5.1)
Sodium: 136 mmol/L (ref 135–145)

## 2014-11-07 MED ORDER — POTASSIUM CHLORIDE 20 MEQ PO PACK
20.0000 meq | PACK | Freq: Two times a day (BID) | ORAL | Status: DC
  Administered 2014-11-07 (×2): 20 meq via ORAL
  Filled 2014-11-07 (×2): qty 1

## 2014-11-07 MED ORDER — FERROUS SULFATE 325 (65 FE) MG PO TABS
325.0000 mg | ORAL_TABLET | Freq: Two times a day (BID) | ORAL | Status: DC
  Administered 2014-11-07 – 2014-11-09 (×4): 325 mg via ORAL
  Filled 2014-11-07 (×4): qty 1

## 2014-11-07 MED ORDER — FUROSEMIDE 20 MG PO TABS
20.0000 mg | ORAL_TABLET | Freq: Once | ORAL | Status: AC
  Administered 2014-11-07: 20 mg via ORAL
  Filled 2014-11-07: qty 1

## 2014-11-07 NOTE — Care Management CHF Note (Signed)
Spoke with patient concerning discharge plan. PT is recommending home health PT. 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 Stated that her daughter is running the home while she is in the hospital. Patient was independent from home prior to her injuries. Stated no history of COPD or breathing problems , however is on 02 here. Unable to wean so far. Patient insurance is [employer]. Contacted Jason at [facility] in anticipation of Home O2 need.Corene Cornea stated that all needed was qualifying o2 saturations. Patient will also possibly need Home health PT. PT unable to ambulate due to low sats. Will ask nurse for [place].today.

## 2014-11-07 NOTE — Progress Notes (Signed)
Physical Therapy Treatment Patient Details Name: Kelli Marks MRN: [id number] DOB: 1960/08/02 Today's Date: 11/07/2014    History of Present Illness Pt admitted for splenic laceration. Pt orginally fell out of bed, came to ED and was discharged. Pt then returned to ED with complaints of dizziness, SOB, and rib fratures (7th and 8th). 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 Pt on ventilator from 5/29-6/1. Pt with history of COPD, ARF, anemia, and hyponatremia.    PT Comments    Pt is making good progress towards goals with increased functional independence this session with less physical assist required. Pt still limited in gait distance secondary to decreased O2 sats. RN notified. Pt reports mod SOB symptoms with exertion. Pt motivated to perform therapy. Exercises written on board with education given to patient. Updated CM on discharge status.  Follow Up Recommendations  Home health PT     Equipment Recommendations  Rolling walker with 5" wheels    Recommendations for Other Services       Precautions / Restrictions Precautions Precautions: None Restrictions Weight Bearing Restrictions: No    Mobility  Bed Mobility Overal bed mobility: Independent Bed Mobility: Supine to Sit     Supine to sit: Independent     General bed mobility comments: supine->sit with independence. Safe technique performed  Transfers Overall transfer level: Needs assistance Equipment used: Rolling walker (2 wheeled) Transfers: Sit to/from Stand Sit to Stand: Min guard         General transfer comment: sit<>Stand with rw and cga. Safe technique performed. All mobility performed with 3L of O2 donned.   Ambulation/Gait Ambulation/Gait assistance: Min guard Ambulation Distance (Feet): 10 Feet Assistive device: Rolling walker (2 wheeled) Gait Pattern/deviations: Step-through pattern     General Gait Details: ambulated to [facility] with rw and cga. O2  sats decreased to 77% with mobility. Increased SOB symptoms noted with cues for pursed lip breathing. After 2 min of resting, sats increased to 89%.   Stairs            Wheelchair Mobility    Modified Rankin (Stroke Patients Only)       Balance                                    Cognition Arousal/Alertness: Awake/alert Behavior During Therapy: WFL for tasks assessed/performed Overall Cognitive Status: Within Functional Limits for tasks assessed                      Exercises Other Exercises Other Exercises: Pt performed supine ther-ex including B LE SLRs, hip abd/add, and heel slides. All ther-ex performed x 10 reps with supervision and cues for correct technique. O2 sats at 83% post ther.ex    General Comments        Pertinent Vitals/Pain Pain Assessment: No/denies pain    Home Living                      Prior Function            PT Goals (current goals can now be found in the care plan section) Acute Rehab PT Goals Patient Stated Goal: to go home PT Goal Formulation: With patient Potential to Achieve Goals: Good Progress towards PT goals: Progressing toward goals    Frequency  Min 2X/week    PT Plan Discharge plan needs to be updated  Co-evaluation             End of Session Equipment Utilized During Treatment: Gait belt;Oxygen Activity Tolerance: Patient tolerated treatment well Patient left: in bed;with bed alarm set     Time: 6720-9198 PT Time Calculation (min) (ACUTE ONLY): 28 min  Charges:  $Gait Training: 8-22 mins $Therapeutic Exercise: 8-22 mins                    G Codes:      Christiaan Strebeck 2014/11/08, 3:36 PM  Greggory Stallion, PT, DPT [phone]

## 2014-11-07 NOTE — Progress Notes (Signed)
5 Days Post-Op   Subjective:  She is awake with no major complaints. She is breathing comfortably has no nausea or vomiting. She denies any abdominal pain.  Vital signs in last 24 hours: Temp:  [98.2 F (36.8 C)-98.7 F (37.1 C)] 98.2 F (36.8 C) (06/03 0815) Pulse Rate:  [85-99] 90 (06/03 0815) Resp:  [17-24] 17 (06/03 0815) BP: (99-127)/(50-76) 124/56 mmHg (06/03 0815) SpO2:  [91 %-98 %] 95 % (06/03 0815) Last BM Date: 10/30/14  Intake/Output from previous day: 06/02 0701 - 06/03 0700 In: 0  Out: 230 [Urine:200; Drains:30]  GI: Her abdomen is soft with minimal abdominal tenderness. Her wound looks good. She has no evidence for infection. She has active bowel sounds.  Lab Results:  CBC  Recent Labs  11/05/14 0639 11/06/14 0651  WBC 17.6* 20.5*  HGB 7.2* 7.9*  HCT 21.7* 23.6*  PLT 313 419   CMP     Component Value Date/Time   NA 136 11/07/2014 0428   NA 138 02/17/2014 2020   K 3.1* 11/07/2014 0428   K 3.9 02/17/2014 2020   CL 97* 11/07/2014 0428   CL 105 02/17/2014 2020   CO2 29 11/07/2014 0428   CO2 32 02/17/2014 2020   GLUCOSE 75 11/07/2014 0428   GLUCOSE 93 02/17/2014 2020   BUN 9 11/07/2014 0428   BUN 4* 02/17/2014 2020   CREATININE 0.48 11/07/2014 0428   CREATININE 0.70 02/17/2014 2020   CALCIUM 8.4* 11/07/2014 0428   CALCIUM 9.2 02/17/2014 2020   PROT 6.3* 10/31/2014 1358   PROT 6.6 02/17/2014 2020   ALBUMIN 3.7 10/31/2014 1358   ALBUMIN 3.6 02/17/2014 2020   AST 19 10/31/2014 1358   AST 12* 02/17/2014 2020   ALT 12* 10/31/2014 1358   ALT 16 02/17/2014 2020   ALKPHOS 45 10/31/2014 1358   ALKPHOS 67 02/17/2014 2020   BILITOT 0.2* 10/31/2014 1358   GFRNONAA >60 11/07/2014 0428   GFRNONAA >60 02/17/2014 2020   GFRAA >60 11/07/2014 0428   GFRAA >60 02/17/2014 2020   PT/INR No results for input(s): LABPROT, INR in the last 72 hours.  Studies/Results: No results found.  Assessment/Plan: She is tolerating a regular diet. I removed her JP  drain today. Her potassium was slightly low at 3.1 so we will give her some repletion dosage. We will recheck her laboratories tomorrow. She should be good to consider discharge in the near future. I will check with care management.

## 2014-11-07 NOTE — Progress Notes (Signed)
Patient ID: Carmelina Peal, female   DOB: 04-May-1961, 54 y.o.   MRN: [id number] [facility] [place] NAME: Cheyanne Lamison    MR#:  [id number]  DATE OF BIRTH:  03/16/1961  SUBJECTIVE:  Patient has a dry mouth. Some abdominal discomfort. Feels weak.  REVIEW OF SYSTEMS:  Review of Systems  Constitutional: Negative for fever and chills.  Eyes: Negative for blurred vision.  Respiratory: Negative for cough and shortness of breath.   Cardiovascular: Negative for chest pain.  Gastrointestinal: Positive for abdominal pain and constipation. Negative for nausea, vomiting and diarrhea.  Genitourinary: Negative for dysuria.  Musculoskeletal: Negative for joint pain.  Neurological: Negative for dizziness and headaches.    DRUG ALLERGIES:  No Known Allergies  VITALS:  Blood pressure 124/56, pulse 90, temperature 98.2 F (36.8 C), temperature source Oral, resp. rate 17, height 5\' 7"  (1.702 m), weight 72.1 kg (158 lb 15.2 oz), SpO2 95 %.  PHYSICAL EXAMINATION:  Physical Exam  Constitutional: She is well-developed, well-nourished, and in no distress. She is not intubated.  HENT:  Head: Normocephalic and atraumatic.  Eyes: Conjunctivae and EOM are normal. Pupils are equal, round, and reactive to light.  Neck: Normal range of motion. Neck supple. No tracheal deviation present. No thyromegaly present.  Cardiovascular: Normal rate, regular rhythm and normal heart sounds.   Pulmonary/Chest: Effort normal. She is not intubated. No respiratory distress. She has decreased breath sounds in the right lower field and the left lower field. She has no wheezes. She has no rhonchi. She has no rales. She exhibits no tenderness.  Abdominal: Soft. Bowel sounds are normal. She exhibits distension. There is generalized tenderness.  Musculoskeletal: Normal range of motion.  Lymphadenopathy:    She has no cervical adenopathy.  Neurological: She is alert. No  cranial nerve deficit.  Skin: Skin is warm and dry.  Hematoma left lower abdomen.  Psychiatric: Mood and affect normal.    LABORATORY PANEL:   CBC  Recent Labs Lab 11/06/14 0651  WBC 20.5*  HGB 7.9*  HCT 23.6*  PLT 419   Chemistries   Recent Labs Lab 10/31/14 1358  11/06/14 0625 11/07/14 0428  NA 128*  < > 135 136  K 4.0  < > 3.6 3.1*  CL 93*  < > 98* 97*  CO2 27  < > 28 29  GLUCOSE 113*  < > 88 75  BUN 22*  < > 8 9  CREATININE 0.90  < > 0.57 0.48  CALCIUM 8.6*  < > 8.2* 8.4*  MG  --   < > 2.0  --   AST 19  --   --   --   ALT 12*  --   --   --   ALKPHOS 45  --   --   --   BILITOT 0.2*  --   --   --   < > = values in this interval not displayed.   ASSESSMENT AND PLAN:   1. acute respiratory failure with hypoxia- I turned the patient's nasal cannula down to 2 L from 3 L. - We'll check room air pulse ox in a.m. I will give 1 dose of by mouth Lasix today. Ambulation with physical therapy and incentive spirometry should also help.   2.  acute blood loss anemia -secondary to the spleen laceration. -Patient transfused a total of 6 units of blood and hemoglobin is 7.9. -Start ferrous sulfate.  3. status post fall  and multiple rib fractures on the left and splenic laceration   4. hyperlipidemia continue simvastatin  5. tobacco abuse-continue nicotine patch.  6.  depression-continue Effexor.  7. urinary incontinence-continue oxybutynin.  8. hyponatremia-improved  9. Weakness- physical therapy recommended rehabilitation.  10 hypokalemia replace orally.  All the records are reviewed and case discussed with Care Management/Social Workerr. Management plans discussed with the patient, and she is in agreement.  CODE STATUS: Full code  TOTAL TIME TAKING CARE OF THIS PATIENT : 20 minutes.    Loletha Grayer M.D on 11/07/2014 at 11:00 AM  Between 7am to 6pm - Pager - [phone]  After 6pm go to [email or web address] - Acupuncturist  Hospitalists  Office  [phone]

## 2014-11-08 LAB — BASIC METABOLIC PANEL
Anion gap: 10 (ref 5–15)
BUN: 8 mg/dL (ref 6–20)
CO2: 28 mmol/L (ref 22–32)
Calcium: 8.1 mg/dL — ABNORMAL LOW (ref 8.9–10.3)
Chloride: 96 mmol/L — ABNORMAL LOW (ref 101–111)
Creatinine, Ser: 0.52 mg/dL (ref 0.44–1.00)
GFR calc Af Amer: >60 mL/min (ref >60)
GFR calc non Af Amer: >60 mL/min (ref >60)
Glucose, Bld: 98 mg/dL (ref 65–99)
Potassium: 3.1 mmol/L — ABNORMAL LOW (ref 3.5–5.1)
Sodium: 134 mmol/L — ABNORMAL LOW (ref 135–145)

## 2014-11-08 LAB — CBC
HEMATOCRIT: 29.1 % — AB (ref 35.0–47.0)
HEMOGLOBIN: 9.4 g/dL — AB (ref 12.0–16.0)
MCH: 28.5 pg (ref 26.0–34.0)
MCHC: 32.4 g/dL (ref 32.0–36.0)
MCV: 88.2 fL (ref 80.0–100.0)
Platelets: 690 10*3/uL — ABNORMAL HIGH (ref 150–440)
RBC: 3.3 MIL/uL — ABNORMAL LOW (ref 3.80–5.20)
RDW: 14.6 % — ABNORMAL HIGH (ref 11.5–14.5)
WBC: 16.7 10*3/uL — ABNORMAL HIGH (ref 3.6–11.0)

## 2014-11-08 MED ORDER — PNEUMOCOCCAL VAC POLYVALENT 25 MCG/0.5ML IJ INJ
0.5000 mL | INJECTION | Freq: Once | INTRAMUSCULAR | Status: AC
  Administered 2014-11-08: 0.5 mL via INTRAMUSCULAR
  Filled 2014-11-08: qty 0.5

## 2014-11-08 MED ORDER — BISACODYL 10 MG RE SUPP
10.0000 mg | Freq: Once | RECTAL | Status: DC
  Filled 2014-11-08: qty 1

## 2014-11-08 MED ORDER — POTASSIUM CHLORIDE 20 MEQ PO PACK
20.0000 meq | PACK | Freq: Three times a day (TID) | ORAL | Status: DC
  Administered 2014-11-08: 20 meq via ORAL
  Filled 2014-11-08 (×3): qty 1

## 2014-11-08 NOTE — Progress Notes (Signed)
6 Days Post-Op   Subjective: She slowly improving. She does not have any shortness of breath or significant abdominal pain. She denies any nausea or vomiting. She has had some flatus but no bowel movement as yet. She is tolerating a regular diet. She wants to be considered for discharge home rather than to any sort of rehabilitation facility.  Vital signs in last 24 hours: Temp:  [98.4 F (36.9 C)-99 F (37.2 C)] 98.4 F (36.9 C) (06/04 0759) Pulse Rate:  [87-93] 87 (06/04 0759) Resp:  [16-18] 18 (06/04 0759) BP: (114-125)/(56-61) 122/61 mmHg (06/04 0759) SpO2:  [92 %-94 %] 94 % (06/04 0759) Last BM Date: 10/30/14  Intake/Output from previous day: 06/03 0701 - 06/04 0700 In: 315 [P.O.:240] Out: 2900 [Urine:2825; Drains:75]  GI: Her abdomen is soft, the wound looks good. There is no evidence for any infection. She does have active bowel sounds.  Lab Results:  CBC  Recent Labs  11/06/14 0651 11/08/14 0715  WBC 20.5* 16.7*  HGB 7.9* 9.4*  HCT 23.6* 29.1*  PLT 419 690*   CMP     Component Value Date/Time   NA 134* 11/08/2014 0715   NA 138 02/17/2014 2020   K 3.1* 11/08/2014 0715   K 3.9 02/17/2014 2020   CL 96* 11/08/2014 0715   CL 105 02/17/2014 2020   CO2 28 11/08/2014 0715   CO2 32 02/17/2014 2020   GLUCOSE 98 11/08/2014 0715   GLUCOSE 93 02/17/2014 2020   BUN 8 11/08/2014 0715   BUN 4* 02/17/2014 2020   CREATININE 0.52 11/08/2014 0715   CREATININE 0.70 02/17/2014 2020   CALCIUM 8.1* 11/08/2014 0715   CALCIUM 9.2 02/17/2014 2020   PROT 6.3* 10/31/2014 1358   PROT 6.6 02/17/2014 2020   ALBUMIN 3.7 10/31/2014 1358   ALBUMIN 3.6 02/17/2014 2020   AST 19 10/31/2014 1358   AST 12* 02/17/2014 2020   ALT 12* 10/31/2014 1358   ALT 16 02/17/2014 2020   ALKPHOS 45 10/31/2014 1358   ALKPHOS 67 02/17/2014 2020   BILITOT 0.2* 10/31/2014 1358   GFRNONAA >60 11/08/2014 0715   GFRNONAA >60 02/17/2014 2020   GFRAA >60 11/08/2014 0715   GFRAA >60 02/17/2014 2020    PT/INR No results for input(s): LABPROT, INR in the last 72 hours.  Studies/Results: No results found.  Assessment/Plan: Her potassium is down slightly and her hemoglobin remained stable. She looks pretty good. We will look at how she does ambulating without oxygen to see what her oxygen saturation is. I discussed situation with the internal medicine doctors. We will increase her potassium supplementation. I'll check on her later this afternoon and will consider discharge today or certainly tomorrow. She is in agreement with this plan.

## 2014-11-08 NOTE — Progress Notes (Signed)
Patient ID: Carmelina Peal, female   DOB: 03/04/1961, 54 y.o.   MRN: [id number] [facility] [place] NAME: Lasya Vetter    MR#:  [id number]  DATE OF BIRTH:  March 23, 1961  SUBJECTIVE:  Patient would like to go home rather than to rehabilitation. Patient with some soreness in the abdomen. Patient has not had a bowel movement yet.  REVIEW OF SYSTEMS:  Review of Systems  Constitutional: Negative for fever and chills.  Eyes: Negative for blurred vision.  Respiratory: Negative for cough and shortness of breath.   Cardiovascular: Negative for chest pain.  Gastrointestinal: Positive for abdominal pain and constipation. Negative for nausea, vomiting and diarrhea.  Genitourinary: Negative for dysuria.  Musculoskeletal: Negative for joint pain.  Neurological: Negative for dizziness and headaches.    DRUG ALLERGIES:  No Known Allergies  VITALS:  Blood pressure 122/61, pulse 87, temperature 98.4 F (36.9 C), temperature source Oral, resp. rate 18, height 5\' 7"  (1.702 m), weight 72.1 kg (158 lb 15.2 oz), SpO2 94 %.  PHYSICAL EXAMINATION:  Physical Exam  Constitutional: She is well-developed, well-nourished, and in no distress. She is not intubated.  HENT:  Head: Normocephalic and atraumatic.  Eyes: Conjunctivae and EOM are normal. Pupils are equal, round, and reactive to light.  Neck: Normal range of motion. Neck supple. No tracheal deviation present. No thyromegaly present.  Cardiovascular: Normal rate, regular rhythm and normal heart sounds.   Pulmonary/Chest: Effort normal. She is not intubated. No respiratory distress. She has decreased breath sounds in the right lower field and the left lower field. She has no wheezes. She has no rhonchi. She has no rales. She exhibits no tenderness.  Abdominal: Soft. Bowel sounds are normal. She exhibits distension. There is generalized tenderness.  Musculoskeletal: Normal range of motion.   Lymphadenopathy:    She has no cervical adenopathy.  Neurological: She is alert. No cranial nerve deficit.  Skin: Skin is warm and dry.  Hematoma left lower abdomen.  Psychiatric: Mood and affect normal.    LABORATORY PANEL:   CBC  Recent Labs Lab 11/08/14 0715  WBC 16.7*  HGB 9.4*  HCT 29.1*  PLT 690*   Chemistries   Recent Labs Lab 11/06/14 0625  11/08/14 0715  NA 135  < > 134*  K 3.6  < > 3.1*  CL 98*  < > 96*  CO2 28  < > 28  GLUCOSE 88  < > 98  BUN 8  < > 8  CREATININE 0.57  < > 0.52  CALCIUM 8.2*  < > 8.1*  MG 2.0  --   --   < > = values in this interval not displayed.   ASSESSMENT AND PLAN:   1. acute respiratory failure with hypoxia- - We'll check room air pulse ox today - Try to taper off oxygen.   2.  acute blood loss anemia -secondary to the spleen laceration. -Patient transfused a total of 6 units of blood -ferrous sulfate. -Hemoglobin up to 9.4 today  3. status post fall and multiple rib fractures on the left and splenic laceration   4. hyperlipidemia continue simvastatin  5. tobacco abuse-continue nicotine patch.  6. depression-continue Effexor.  7. urinary incontinence-continue oxybutynin.  8. hyponatremia-improved  9. Weakness- physical therapy recommended rehabilitation but patient would like to go home.  10. hypokalemia replace orally.  11. Constipation- case discussed with Dr. Pat Patrick he will order something for constipation.  12. Pneumovax ordered secondary to splenectomy.  All the records are reviewed and case discussed with Care Management/Social Workerr. Management plans discussed with the patient, and she is in agreement.  CODE STATUS: Full code  TOTAL TIME TAKING CARE OF THIS PATIENT : 20 minutes.    Loletha Grayer M.D on 11/08/2014 at 8:30 AM  Between 7am to 6pm - Pager - [phone]  After 6pm go to [email or web address] - Acupuncturist Hospitalists  Office  [phone]

## 2014-11-09 MED ORDER — MOMETASONE FURO-FORMOTEROL FUM 100-5 MCG/ACT IN AERO: 2.0000 | INHALATION_SPRAY | Freq: Two times a day (BID) | RESPIRATORY_TRACT | Status: DC

## 2014-11-09 MED ORDER — MOMETASONE FURO-FORMOTEROL FUM 100-5 MCG/ACT IN AERO
2.0000 | INHALATION_SPRAY | Freq: Two times a day (BID) | RESPIRATORY_TRACT | Status: DC
  Administered 2014-11-09: 2 via RESPIRATORY_TRACT
  Filled 2014-11-09: qty 8.8

## 2014-11-09 MED ORDER — TIOTROPIUM BROMIDE MONOHYDRATE 18 MCG IN CAPS
18.0000 ug | ORAL_CAPSULE | Freq: Every day | RESPIRATORY_TRACT | Status: DC
  Administered 2014-11-09: 18 ug via RESPIRATORY_TRACT
  Filled 2014-11-09: qty 5

## 2014-11-09 MED ORDER — ALBUTEROL SULFATE HFA 108 (90 BASE) MCG/ACT IN AERS: 2.0000 | INHALATION_SPRAY | Freq: Four times a day (QID) | RESPIRATORY_TRACT | Status: DC | PRN

## 2014-11-09 MED ORDER — FERROUS SULFATE 325 (65 FE) MG PO TABS: 325.0000 mg | ORAL_TABLET | Freq: Every day | ORAL | Status: DC

## 2014-11-09 MED ORDER — TIOTROPIUM BROMIDE MONOHYDRATE 18 MCG IN CAPS: 18.0000 ug | ORAL_CAPSULE | Freq: Every day | RESPIRATORY_TRACT | Status: DC

## 2014-11-09 NOTE — Care Management Note (Signed)
Case Management Note  Patient Details  Name: Kelli Marks MRN: [id number] Date of Birth: Jul 25, 1960  Subjective/Objective:                 Request faxed and called to [facility] for delivery today of a portable oxygen tank to room 205 at [facility], and for home oxygen set up at [name] home address. Also faxed and called a request to [facility] for PT, RN, Aid, Respiratory Care. [name] had chosen [facility] earlier this week with Case Manager Liliane Channel who notified [facility] of potential oxygen order. Qualifying oxygen SATs were obtained 11/08/14.       Expected Discharge Date:                  Expected Discharge Plan:     In-House Referral:     Discharge planning Services     Post Acute Care Choice:    Choice offered to:     DME Arranged:    DME Agency:     HH Arranged:    HH Agency:     Status of Service:  In process, will continue to follow  Medicare Important Message Given:    Date Medicare IM Given:    Medicare IM give by:    Date Additional Medicare IM Given:    Additional Medicare Important Message give by:     If discussed at [place] of Stay Meetings, dates discussed:    Additional Comments:  Izzy Doubek A, RN 11/09/2014, 9:22 AM

## 2014-11-09 NOTE — Progress Notes (Signed)
Pt rested during evening, no acute distress. Denied pain on assessment. Plan is possible d/c home w/ PT today. Will cont to monitor.

## 2014-11-09 NOTE — Discharge Instructions (Signed)
° °  These instructions give you information on caring for yourself after your procedure. Your doctor may also give you more specific instructions. Call your doctor if you have any problems or questions after your procedure.  HOME CARE  Change your bandages (dressings) as told by your doctor.  Keep the wound dry and clean. Wash the wound gently with soap and water. Pat the wound dry with a clean towel.  Do not take baths, swim, or use hot tubs for 2 weeks, or as told by your doctor.  Only take medicine as told by your doctor.  Eat a normal diet as told by your doctor.  Do not lift anything heavier than 10 pounds (4.5 kg) until your doctor says it is okay.  Do not play contact sports for 1 week, or as told by your doctor. GET HELP IF:  Your wound is red, puffy (swollen), or painful.  You have yellowish-white fluid (pus) coming from the wound.  You have fluid draining from the wound for more than 1 day.  You have a bad smell coming from the wound.  Your wound breaks open. GET HELP RIGHT AWAY IF:  You have trouble breathing.  You have chest pain.  You have a fever >101  You have pain in the shoulders (shoulder strap areas) that is getting worse.  You feel dizzy or pass out (faint).  You have severe belly (abdominal) pain.  You feel sick to your stomach (nauseous) or throw up (vomit) for more than 1 day.

## 2014-11-09 NOTE — Progress Notes (Signed)
Patient ID: Carmelina Peal, female   DOB: [birth year], 54 y.o.   MRN: [id number] [place] [place] NAME: Ronalee Scheunemann    MR#:  [id number]  DATE OF BIRTH:  03/22/1961  SUBJECTIVE:  Patient feels okay. Does not complain of shortness of breath. She does complain of pain in her left arm at site of IV infiltration. Some abdominal discomfort. She has had a bowel movement.  REVIEW OF SYSTEMS:  Review of Systems  Constitutional: Negative for fever and chills.  Eyes: Negative for blurred vision.  Respiratory: Negative for cough and shortness of breath.   Cardiovascular: Negative for chest pain.  Gastrointestinal: Positive for abdominal pain. Negative for nausea, vomiting, diarrhea and constipation.  Genitourinary: Negative for dysuria.  Musculoskeletal: Negative for joint pain.  Neurological: Negative for dizziness and headaches.   musculoskeletal continued: left arm pain  DRUG ALLERGIES:  No Known Allergies  VITALS:  Blood pressure 105/56, pulse 85, temperature 98.9 F (37.2 C), temperature source Oral, resp. rate 18, height 5\' 7"  (1.702 m), weight 72.1 kg (158 lb 15.2 oz), SpO2 82 %.  PHYSICAL EXAMINATION:  Physical Exam  Constitutional: She is well-developed, well-nourished, and in no distress.  HENT:  Head: Normocephalic and atraumatic.  Eyes: Conjunctivae and EOM are normal. Pupils are equal, round, and reactive to light.  Neck: Normal range of motion. Neck supple. No tracheal deviation present. No thyromegaly present.  Cardiovascular: Normal rate, regular rhythm and normal heart sounds.   Pulmonary/Chest: Effort normal. No respiratory distress. She has decreased breath sounds in the right lower field and the left lower field. She has no wheezes. She has no rhonchi. She has no rales. She exhibits no tenderness.  Abdominal: Soft. Bowel sounds are normal. There is generalized tenderness.  Musculoskeletal: Normal range of  motion.  Lymphadenopathy:    She has no cervical adenopathy.  Neurological: She is alert. No cranial nerve deficit.  Skin: Skin is warm and dry.  Psychiatric: Mood and affect normal.    LABORATORY PANEL:   CBC  Recent Labs Lab 11/08/14 0715  WBC 16.7*  HGB 9.4*  HCT 29.1*  PLT 690*   Chemistries   Recent Labs Lab 11/06/14 0625  11/08/14 0715  NA 135  < > 134*  K 3.6  < > 3.1*  CL 98*  < > 96*  CO2 28  < > 28  GLUCOSE 88  < > 98  BUN 8  < > 8  CREATININE 0.57  < > 0.52  CALCIUM 8.2*  < > 8.1*  MG 2.0  --   --   < > = values in this interval not displayed.   ASSESSMENT AND PLAN:   1. acute respiratory failure with hypoxia- likely combination of COPD, rib fractures and major abdominal surgery. - P O2 on ABG is 37. Patient will need home oxygen 2 L nasal cannula 24/ 7. -Care manager consultation to set up home oxygen and home health. -I did prescribe albuterol inhaler, Spiriva, and dulera inhaler. - Patient can be discharged home   2.  acute blood loss anemia -secondary to the spleen laceration. -Patient transfused a total of 6 units of blood -ferrous sulfate. -Hemoglobin up to 9.4 today  3. status post fall and multiple rib fractures on the left and splenic laceration - disposition up to surgery.  4. hyperlipidemia continue simvastatin  5. tobacco abuse-continue nicotine patch.  6. depression-continue Effexor.  7. urinary incontinence-continue oxybutynin.  8. hyponatremia-improved  9. Weakness- physical therapy recommended rehabilitation but patient would like to go home.  10. hypokalemia replace orally.  11. Left arm IV infiltration- warm soaks every 4 hours for 20 minutes recommended.  12. Pneumovax given yesterday.  CODE STATUS: Full code  TOTAL TIME TAKING CARE OF THIS PATIENT : 25 minutes.    Loletha Grayer M.D on 11/09/2014 at 8:30 AM  Between 7am to 6pm - Pager - [phone]  After 6pm go to [email or web address] - Publishing rights manager Hospitalists  Office  [phone]

## 2014-11-09 NOTE — Progress Notes (Signed)
Let nurse know 

## 2014-11-09 NOTE — Progress Notes (Signed)
SATURATION QUALIFICATIONS: (This note is used to comply with regulatory documentation for home oxygen)  Patient Saturations on Room Air at Rest = 98%  Patient Saturations on Room Air while Ambulating = 81%  Patient Saturations on 3 Liters of oxygen while Ambulating = 94%  Please briefly explain why patient needs home oxygen:

## 2014-11-09 NOTE — Progress Notes (Signed)
A&O. VSS. Tolerating diet well. Medicated for pain with relief noted. Sent home with home health with 2L O2. Discharge instructions reviewed with pt and pt verbalized understanding. IV removed per policy. Dressing dry and intact. Discharged via wheelchair escorted by nursing staff.

## 2014-11-10 ENCOUNTER — Other Ambulatory Visit: Payer: Self-pay | Admitting: *Deleted

## 2014-11-10 ENCOUNTER — Telehealth: Payer: Self-pay | Admitting: Surgery

## 2014-11-10 LAB — BLOOD GAS, ARTERIAL
Acid-Base Excess: 8.8 mmol/L — ABNORMAL HIGH (ref 0.0–3.0)
Bicarbonate: 32.8 mEq/L — ABNORMAL HIGH (ref 21.0–28.0)
FIO2: 0.21 %
O2 SAT: 76.7 %
PATIENT TEMPERATURE: 37
PO2 ART: 37 mmHg — AB (ref 83.0–108.0)
pCO2 arterial: 42 mmHg (ref 32.0–48.0)
pH, Arterial: 7.5 — ABNORMAL HIGH (ref 7.350–7.450)

## 2014-11-10 NOTE — Telephone Encounter (Signed)
Pt called and said she did not get a script called pharmacy and they had nothing. [facility] [phone]

## 2014-11-11 NOTE — Discharge Summary (Signed)
Physician Discharge Summary  Patient ID: Kelli Marks MRN: [id number] DOB/AGE: 07-Jan-1961 54 y.o.  Admit date: 10/31/2014 Discharge date: 11/08/2014  Admission Diagnoses: Splenic laceration, hypoxia, COPD, acute respiratory failure, anemia secondary to splenic fracture.  Discharge Diagnoses:  Principal Problem:   Splenic laceration Active Problems:   Acute respiratory failure   Anemia   Hyponatremia   Discharged Condition: Stable and Improved.  Hospital Course:    The patient was admitted several days after a fall from her bed with multiple rib fractures on the left side. Splenic injury was noted. It appeared to be contained within the capsule.  Initial hemoglobin was 7.5. She was admitted to the intensive care unit placed on bed rest and serial hemoglobins were obtained. She was given 2 units of blood.   Pulmonary medicine and internal medicine were consulted due to her significant hypoxia. Initially she was placed on BiPAP and then weaned down to high flow oxygen. There was no active extravasation CT scan. On hospital day #2 the patient was transferred to the floor. She had received 3 units of blood since admission. She was complaining of some left upper quadrant abdominal pain.  On hospital day #3 patient was found to have a hemoglobin of 6 was markedly tachycardic compared to the day before. At this point she was brought to the operating room for an urgent splenectomy.    Postoperatively she remained in intensive care unit on the ventilator was weaned over the course of several days. She received a total of 6 units of blood was in the hospital. She was able to be extubated her drain was able to be removed. She tolerated a regular diet and was discharged home in stable condition on 4 June. She'll follow up with [place] in the office in one week.  She will need outpatient post-splenectomy vaccinations.   Consults: Internal medicine and pulmonary/critical care.  Significant Diagnostic  Studies: CT scan of the abdomen and pelvis  Disposition: 01-Home or Self Care  Discharge Instructions    Diet - low sodium heart healthy    Complete by:  As directed      Driving Restrictions    Complete by:  As directed   Do not drive while taking pain medicine     Increase activity slowly    Complete by:  As directed      Lifting restrictions    Complete by:  As directed   Do not lift anything bigger than your dinner plate until seen in the office     No wound care    Complete by:  As directed             Medication List    TAKE these medications        albuterol 108 (90 BASE) MCG/ACT inhaler  Commonly known as:  PROVENTIL HFA;VENTOLIN HFA  Inhale 2 puffs into the lungs every 6 (six) hours as needed for wheezing or shortness of breath.     aspirin EC 81 MG tablet  Take 81 mg by mouth daily.     ferrous sulfate 325 (65 FE) MG tablet  Take 1 tablet (325 mg total) by mouth daily with breakfast.     mometasone-formoterol 100-5 MCG/ACT Aero  Commonly known as:  DULERA  Inhale 2 puffs into the lungs 2 (two) times daily.     ondansetron 4 MG tablet  Commonly known as:  ZOFRAN  Take 1 tablet (4 mg total) by mouth daily as needed for nausea or vomiting.  oxybutynin 5 MG tablet  Commonly known as:  DITROPAN  Take 5 mg by mouth 2 (two) times daily.     oxyCODONE-acetaminophen 5-325 MG per tablet  Commonly known as:  ROXICET  Take 1 tablet by mouth every 4 (four) hours as needed for severe pain.     simvastatin 10 MG tablet  Commonly known as:  ZOCOR  Take 10 mg by mouth at bedtime.     tiotropium 18 MCG inhalation capsule  Commonly known as:  SPIRIVA  Place 1 capsule (18 mcg total) into inhaler and inhale daily.     venlafaxine XR 150 MG 24 hr capsule  Commonly known as:  EFFEXOR-XR  Take 150 mg by mouth daily.           Follow-up Information    Follow up with [facility] In 1 week.   Why:  For suture removal, For wound re-check    Contact information:   [address] [place] [address] [phone]      Signed: Sherri Rad 11/11/2014, 8:52 PM

## 2014-11-11 NOTE — Telephone Encounter (Signed)
Returned patient call. Patient stated that she is still having some "non severe" pain and would like to have a refill on pain medications. Informed the patient that the surgeon was not presently in clinic and would be able to refill her pain medications if needed during her visit on 11/12/14. Pt. Confirmed understanding of direction.

## 2014-11-12 ENCOUNTER — Ambulatory Visit (INDEPENDENT_AMBULATORY_CARE_PROVIDER_SITE_OTHER): Payer: 59 | Admitting: Surgery

## 2014-11-12 ENCOUNTER — Encounter: Payer: Self-pay | Admitting: Surgery

## 2014-11-12 VITALS — BP 125/65 | HR 79 | Temp 98.7°F | Resp 20 | Ht 67.0 in | Wt 144.0 lb

## 2014-11-12 DIAGNOSIS — S36039D Unspecified laceration of spleen, subsequent encounter: Secondary | ICD-10-CM

## 2014-11-12 MED ORDER — OXYCODONE-ACETAMINOPHEN 5-325 MG PO TABS: 1.0000 | ORAL_TABLET | Freq: Four times a day (QID) | ORAL | Status: DC | PRN

## 2014-11-12 NOTE — Progress Notes (Signed)
Doing well.  Tolerating diet.  Some pain as did not receive pain medications.  Having good BM.    Blood pressure 125/65, pulse 79, temperature 98.7 F (37.1 C), temperature source Oral, resp. rate 20, height 5\' 7"  (1.702 m), weight 144 lb (65.318 kg), SpO2 95 %. GEN: NAD/A&Ox3 ABD: soft, min tender, nondistended, incision c/d/i with staples  Labs: none Imaging: None  A/P 54 yo s/p splenectomy, doing well.  Rx for percocet given.  Half of staples removed.  Will bring back for nursing visit next week.  F/u in 2 weeks with MD.  Will require pneumococcal, meningococcal and H. Influenza B vaccines.  Would give now but suggestions that these not be done prior to 2 weeks postop due to decreased efficacy.

## 2014-11-12 NOTE — Patient Instructions (Signed)
Do not drive on pain medications Do not lift greater than 15 lbs for a period of 6 weeks Call or return to ER if you develop fever greater than 101.5, nausea/vomiting, increased pain, redness/drainage from incisions Okay to shower.

## 2014-11-12 NOTE — Progress Notes (Deleted)
Subjective:     Patient ID: Kelli Marks, female   DOB: [birth year], 54 y.o.   MRN: [id number]  HPI   Review of Systems     Objective:   Physical Exam     Assessment:     ***    Plan:     ***

## 2014-11-19 ENCOUNTER — Ambulatory Visit: Payer: 59 | Admitting: *Deleted

## 2014-11-19 VITALS — BP 132/69 | HR 99 | Temp 98.5°F | Resp 20

## 2014-11-19 DIAGNOSIS — S36039D Unspecified laceration of spleen, subsequent encounter: Secondary | ICD-10-CM

## 2014-11-19 NOTE — Progress Notes (Deleted)
Subjective:     Patient ID: Kelli Marks, female   DOB: Oct 08, 1960, 54 y.o.   MRN: [id number]  HPI   Review of Systems     Objective:   Physical Exam     Assessment:     ***    Plan:     ***

## 2014-11-19 NOTE — Patient Instructions (Signed)
Removed remaining staples and applied Steri strips over incision site. The incision site healing very well with no redness or irritation. Pt. Complaining of constipation. She states that she is having small BM's every few days and is feeling bloated. Patient informed that the pain medication she is taking is contributing to her constipation and that she needs reduce the amount she is taking as she is able. Suggested the patient try a OTC laxative to help with BM's. Patient confirmed understanding of information.

## 2014-11-24 ENCOUNTER — Telehealth: Payer: Self-pay | Admitting: Surgery

## 2014-11-24 NOTE — Telephone Encounter (Signed)
Pt only has two pain pills left and would like to have a refill before her appt. This week.  [facility]

## 2014-11-24 NOTE — Telephone Encounter (Signed)
Spoke with patient at this time. States pain is not getting worse and denies any other symptoms but states that she is still using pain medication to help her sleep PRN.   Explained that I would speak with our Surgeon first thing in am and give her a call in regards to refill.  Pt currently taking Percocet 5/325 1 Tab q6h, PRN.

## 2014-11-25 NOTE — Telephone Encounter (Signed)
Spoke with patient at this time and explained that Prescription is ready for pickup at [facility] location. Pt given directions.   Script for Percocet 5/325 tab , 1 Tab q6h, PRN- severe pain #25. 0 RF.  States that her car is in the shop and it will most likely be her son-in-law to pick this up for her today.

## 2014-11-27 ENCOUNTER — Encounter: Payer: Self-pay | Admitting: Surgery

## 2014-11-27 ENCOUNTER — Ambulatory Visit (INDEPENDENT_AMBULATORY_CARE_PROVIDER_SITE_OTHER): Payer: 59 | Admitting: Surgery

## 2014-11-27 VITALS — BP 105/67 | HR 105 | Temp 99.6°F | Ht 67.0 in | Wt 140.0 lb

## 2014-11-27 DIAGNOSIS — F329 Major depressive disorder, single episode, unspecified: Secondary | ICD-10-CM | POA: Insufficient documentation

## 2014-11-27 DIAGNOSIS — S3600XD Unspecified injury of spleen, subsequent encounter: Secondary | ICD-10-CM

## 2014-11-27 DIAGNOSIS — F17210 Nicotine dependence, cigarettes, uncomplicated: Secondary | ICD-10-CM | POA: Insufficient documentation

## 2014-11-27 DIAGNOSIS — F32A Depression, unspecified: Secondary | ICD-10-CM | POA: Insufficient documentation

## 2014-11-27 DIAGNOSIS — K219 Gastro-esophageal reflux disease without esophagitis: Secondary | ICD-10-CM | POA: Insufficient documentation

## 2014-11-27 DIAGNOSIS — G4700 Insomnia, unspecified: Secondary | ICD-10-CM | POA: Insufficient documentation

## 2014-11-27 DIAGNOSIS — N3281 Overactive bladder: Secondary | ICD-10-CM | POA: Insufficient documentation

## 2014-11-27 NOTE — Patient Instructions (Signed)
You may resume lifting and driving and discontinue your supplemental oxygen

## 2014-11-27 NOTE — Progress Notes (Signed)
Outpatient Surgical Follow Up  11/27/2014  Kelli Marks is an 54 y.o. female.   Chief Complaint  Patient presents with  . PO Spleen    HPI: She underwent splenectomy following splenic laceration and subsequent significant bleeding episode. She has recovered for the most part uneventfully. She is feeling much better with more energy level and better activity tolerance. She is eating well with no significant dietary complaints. She appears to be breathing comfortably and has had no problem with gross of breath with exercise. She has stopped using her oxygen most of the day.  Past Medical History  Diagnosis Date  . Hypercholesteremia   . Hyperlipidemia   . Rib fractures     Past Surgical History  Procedure Laterality Date  . Abdominal hysterectomy    . Ankle surgery Left   . Cholecystectomy    . Tonsillectomy    . 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 Rectal cancer Mother   . Lung cancer Father     Social History:  reports that she has been smoking Cigarettes.  She has a 30 pack-year smoking history. She has never used smokeless tobacco. She reports that she does not drink alcohol or use illicit drugs.  Allergies: No Known Allergies  Medications reviewed.    ROS no change since hospitalization.    BP 105/67 mmHg  Pulse 105  Temp(Src) 99.6 F (37.6 C) (Oral)  Ht 5\' 7"  (1.702 m)  Wt 140 lb (63.504 kg)  BMI 21.92 kg/m2  Physical Exam her wounds look good. There is no evidence for infection. Her staples had been removed. She has active bowel sounds. Her lung function appears to be normal and her lungs are clear.     No results found for this or any previous visit (from the past 48 hour(s)). No results found.  Assessment/Plan:  1. Spleen injury, subsequent encounter She seems to be doing quite well  post surgery. Her energy level and activity levels are returning to her normal. We discussed lifting most of her restrictions. We cautioned her against any heavy lifting. We will see her back in the office as necessary.     Dia Crawford III  11/27/2014,negative

## 2014-12-12 ENCOUNTER — Telehealth: Payer: Self-pay | Admitting: Surgery

## 2014-12-12 NOTE — Telephone Encounter (Signed)
Called patient to let her know that prescription is in [place] office. She states that she is out of town but will pick up as soon as she returns on Monday morning.

## 2014-12-12 NOTE — Telephone Encounter (Signed)
Pt LVM that she is still having pain and she said Dr. Pat Patrick would call her in more pain medications.

## 2014-12-12 NOTE — Telephone Encounter (Signed)
Called patient to explain that I would speak with Dr. Pat Patrick as soon as he is out of the OR today and that I will call her when her prescription is available for pick-up.

## 2014-12-12 NOTE — Telephone Encounter (Signed)
Patient states Dr Pat Patrick told her to call if she needs more pain medication. She has 2 pain pills left and would like to get a refill. Please call and advise

## 2014-12-29 ENCOUNTER — Ambulatory Visit: Payer: 59 | Admitting: Surgery

## 2015-02-17 ENCOUNTER — Telehealth: Payer: Self-pay | Admitting: Surgery

## 2015-02-17 NOTE — Telephone Encounter (Signed)
Spoke with patient at this time. Explained that I have found 11/12/14 progress note from Dr. Rexene Edison that patient is in need of immunizations. She states that she will pick this up in [facility] office tomorrow afternoon.  Faxed over to [facility] office at this time and made office staff aware to have ready for patient pick-up.  Will be glad to assist patient in any other needs to get immunizations completed.

## 2015-02-17 NOTE — Telephone Encounter (Signed)
Patient called saying Kelli Marks mentioned she needs to have a HIB shot since her spleen has been removed, per patient she needs a note saying she needs it in order to have the shot at the [facility].

## 2015-06-10 LAB — TYPE AND SCREEN
ABO/RH(D): O NEG
Antibody Screen: NEGATIVE
Unit division: 0
Unit division: 0
Unit division: 0
Unit division: 0
Unit division: 0
Unit division: 0
Unit division: 0
Unit division: 0
Unit division: 0

## 2015-10-23 IMAGING — CT CT CHEST W/ CM
3 of 4 series · 14 of 30 positions shown, 17 images · IV contrast (omnipaque)
Comparison: Chest radiograph of same day.

CLINICAL DATA: Left-sided chest pain and shortness of breath after
fall out of bed 2 days ago.

EXAM:
CT CHEST WITH CONTRAST
TECHNIQUE: Multidetector CT imaging of the chest was performed during
intravenous contrast administration.
CONTRAST:  75mL OMNIPAQUE IOHEXOL 300 MG/ML  SOLN

[Series 2: routine chest with · axial · 0.60mm/px · z∈[-108,+102]mm · 9 of 54 slices shown, 12 images (1 of 2)]
[im 6/54  mediastinal]
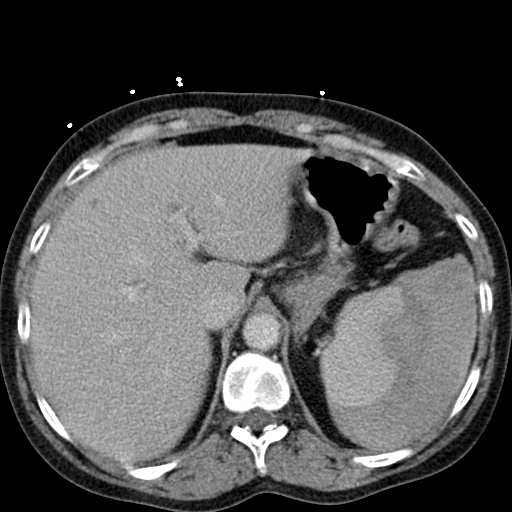
[im 6/54  lung]
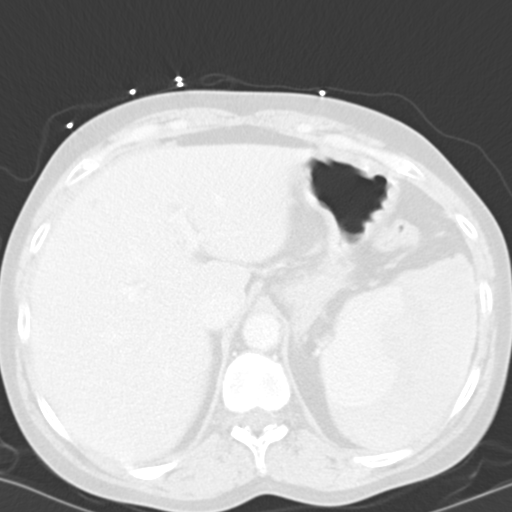
[im 12/54  lung]
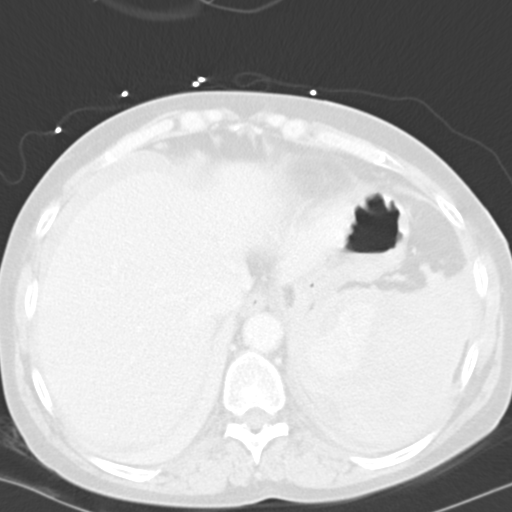
[im 18/54  lung]
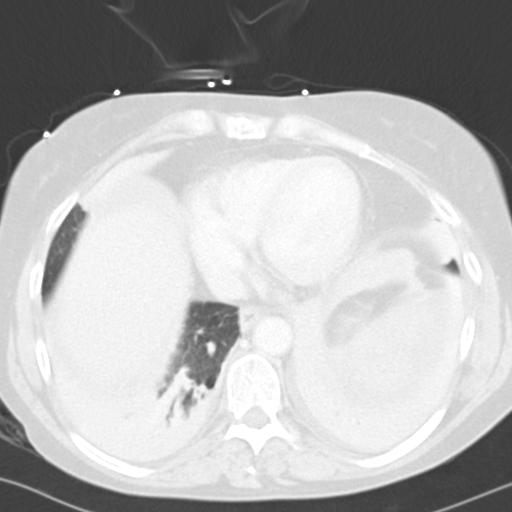
[im 19/54  lung]
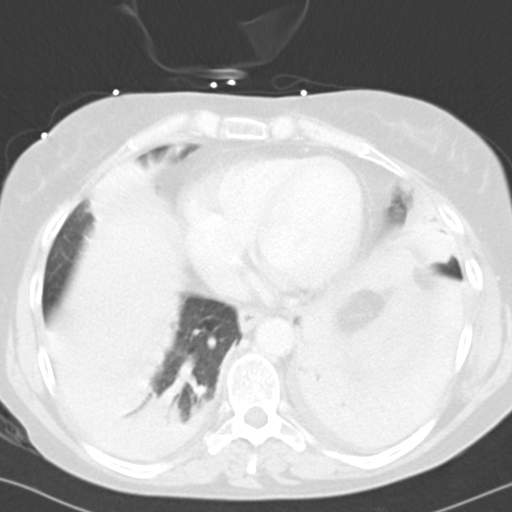
[im 24/54  mediastinal]
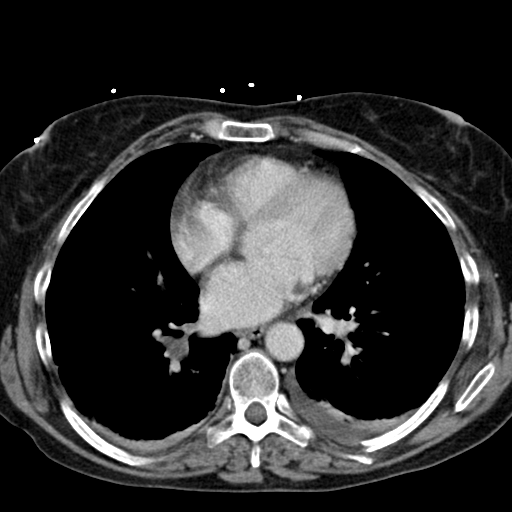
[im 24/54  lung]
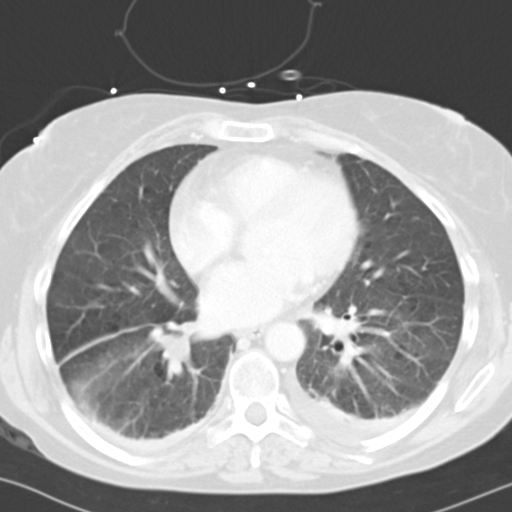
[im 30/54  lung]
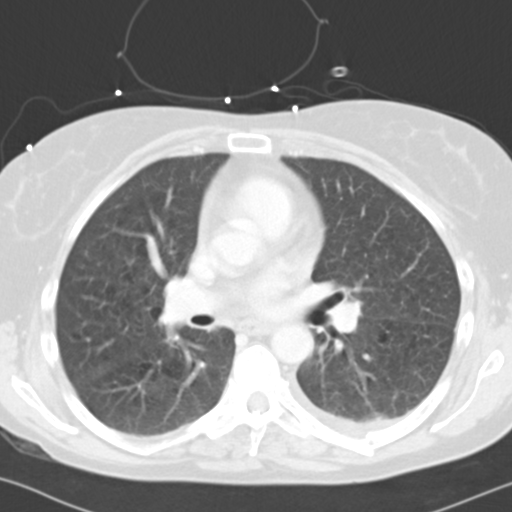
[im 36/54  lung]
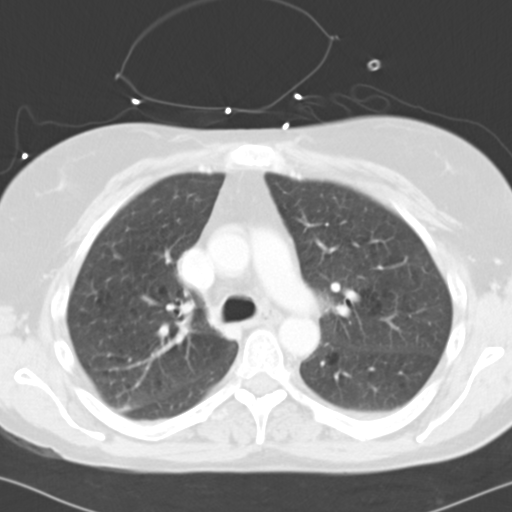
[im 42/54  lung]
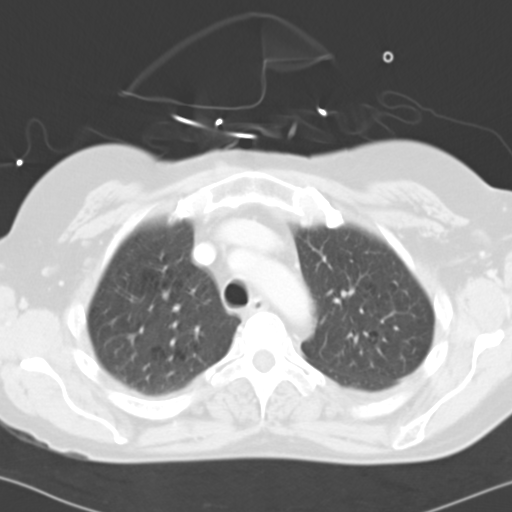
[im 48/54  mediastinal]
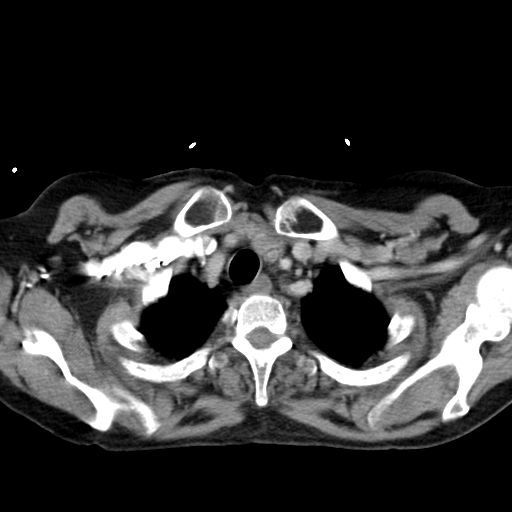
[im 48/54  lung]
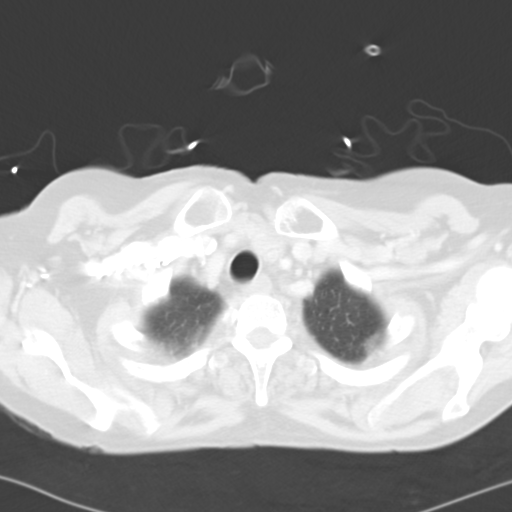

[Series 5: routine chest with · axial · 0.60mm/px · z∈[-172,-106]mm · 3 of 27 slices shown (2 of 2)]
[im 7/27  lung]
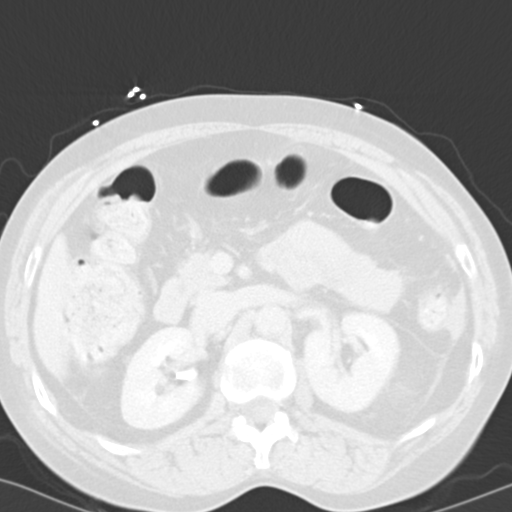
[im 14/27  lung]
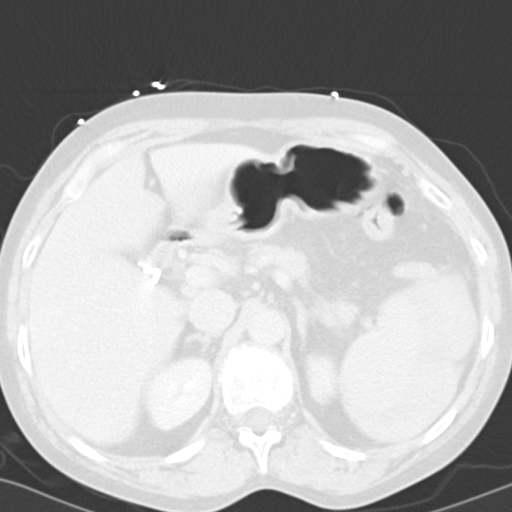
[im 20/27  lung]
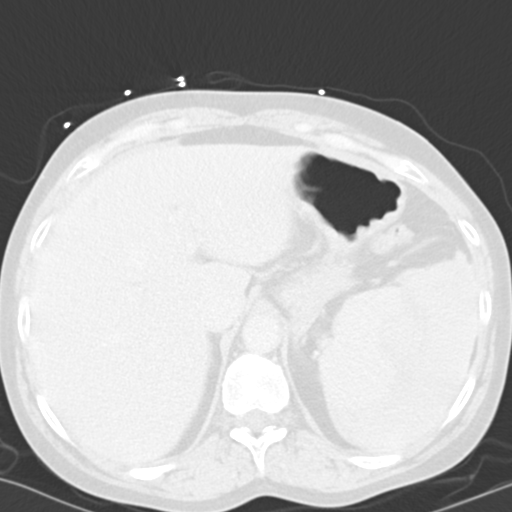

[Series 6: lung · axial · 0.60mm/px · z∈[-152,-112]mm · 2 of 25 slices shown]
[im 9/25  lung]
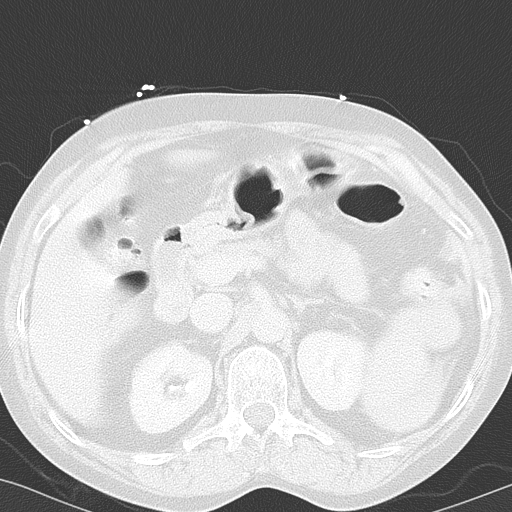
[im 17/25  lung]
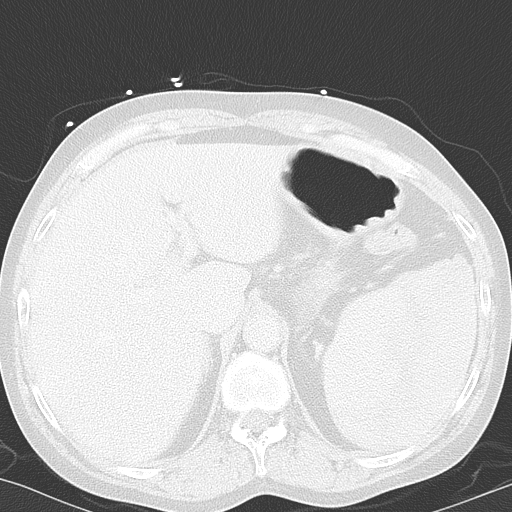

[14 of 30 positions shown; findings below may reference images not displayed]

FINDINGS: No pneumothorax is noted. Mild emphysematous changes noted in the
upper lobes bilaterally. Thoracic aorta appears normal. Visualized
portions of pulmonary arteries appear normal. No mediastinal mass or
adenopathy is noted. Mild bilateral posterior basilar atelectasis is
noted with minimal associated pleural effusions. Fluid or mucous
plugging is noted within the right lower lobe bronchi.

Nondisplaced fracture is seen involving the posterior portion of the
left sixth rib. Mildly displaced fracture seen involving posterior
portion of the left seventh rib. Moderately displaced fracture of
the posterior portion of left eighth rib is noted.

Large hematoma is seen surrounding the spleen consistent with
splenic laceration.
IMPRESSION: There is noted fluid or mucous plugging within the right lower lobe
bronchi.

Multiple fractures are seen involving the posterior portions of the
left ribs.

Mild bilateral posterior basilar atelectasis is noted with minimal
associated pleural effusions.

Large hematoma is seen surrounding the spleen consistent with
splenic laceration.

Critical Value/emergent results were called by telephone at the time
of interpretation on 10/31/2014 at [DATE] to Dr. FILIPE APP ,
who verbally acknowledged these results.

## 2015-10-23 IMAGING — CT CT ABD-PELV W/ CM
1 of 3 series · 14 of 32 positions shown, 19 images · IV contrast (omnipaque)
Comparison: None.

CLINICAL DATA: Recent fall with left rib fractures and abnormal
spleen on recent chest CT

EXAM:
CT ABDOMEN AND PELVIS WITH CONTRAST
TECHNIQUE: Multidetector CT imaging of the abdomen and pelvis was performed
using the standard protocol following bolus administration of
intravenous contrast.
CONTRAST:  75mL OMNIPAQUE IOHEXOL 300 MG/ML  SOLN

[Series 2: routine abd pel with · axial · 0.76mm/px · z∈[-571,-131]mm · 14 of 100 slices shown, 19 images]
[im 6/100  soft-tissue]
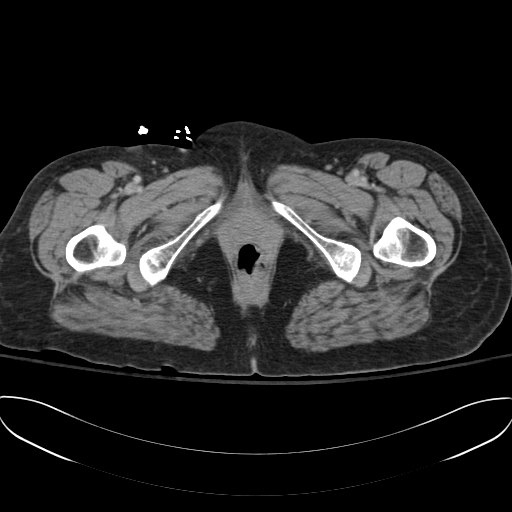
[im 6/100  bone]
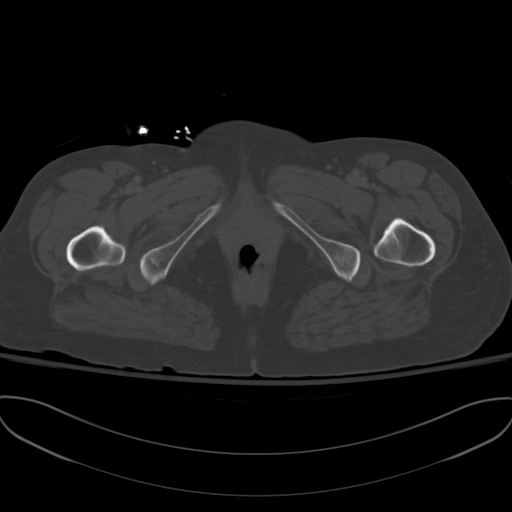
[im 12/100  soft-tissue]
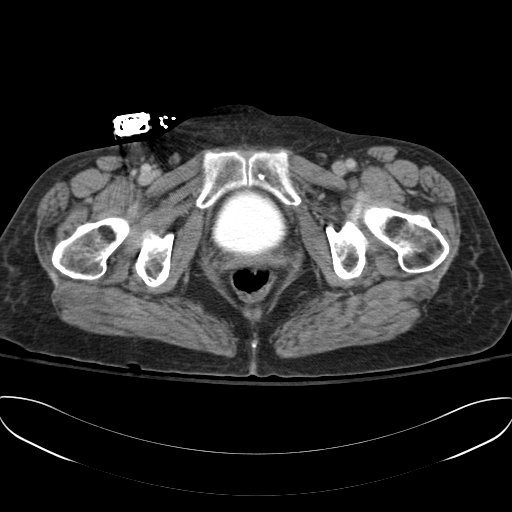
[im 23/100  soft-tissue]
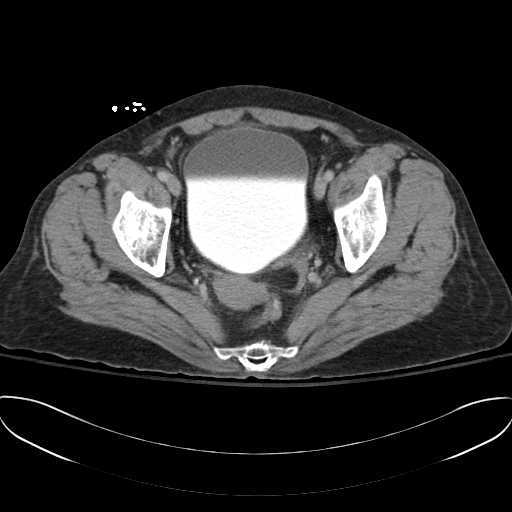
[im 28/100  soft-tissue]
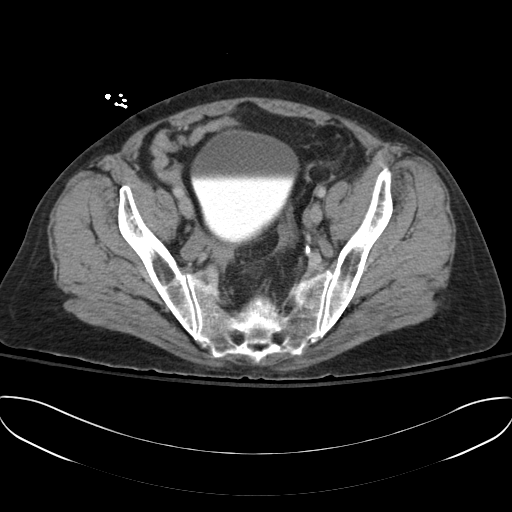
[im 34/100  soft-tissue]
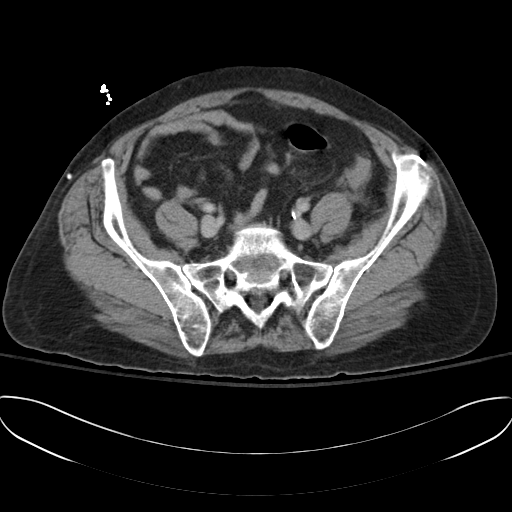
[im 45/100  soft-tissue]
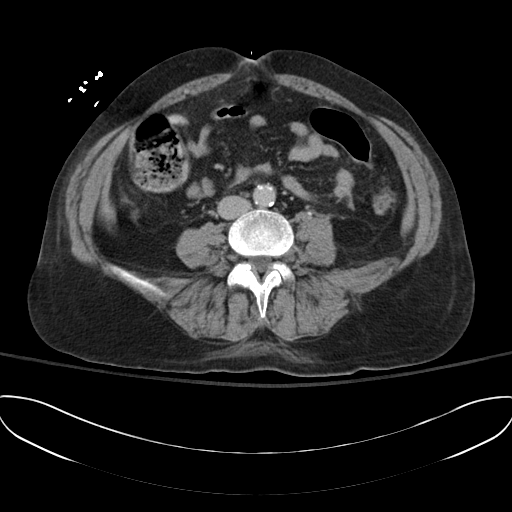
[im 50/100  soft-tissue]
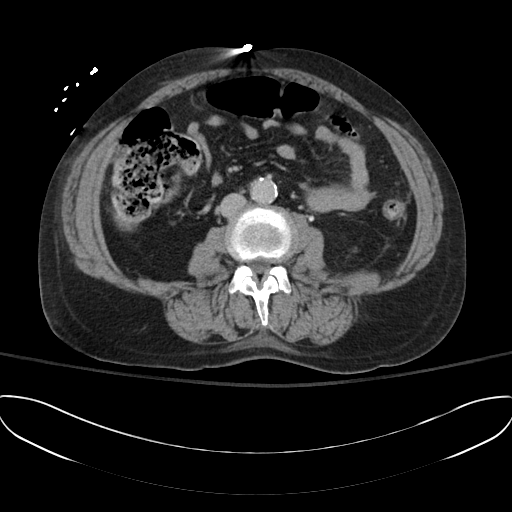
[im 56/100  soft-tissue]
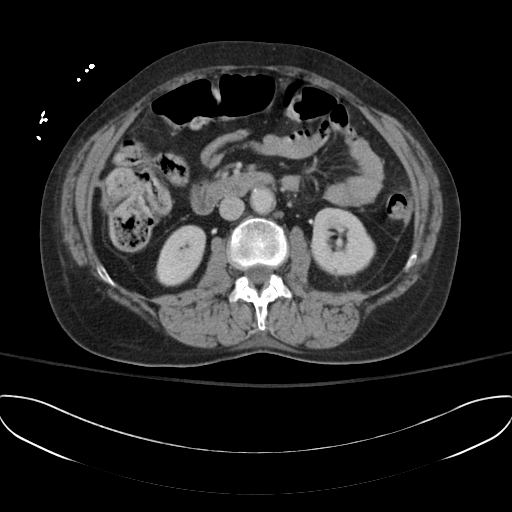
[im 67/100  soft-tissue]
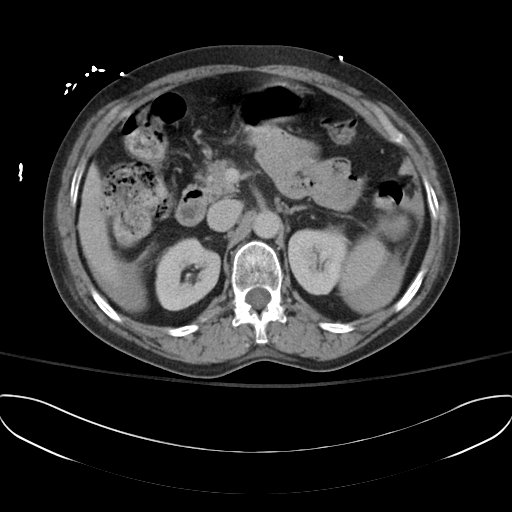
[im 67/100  bone]
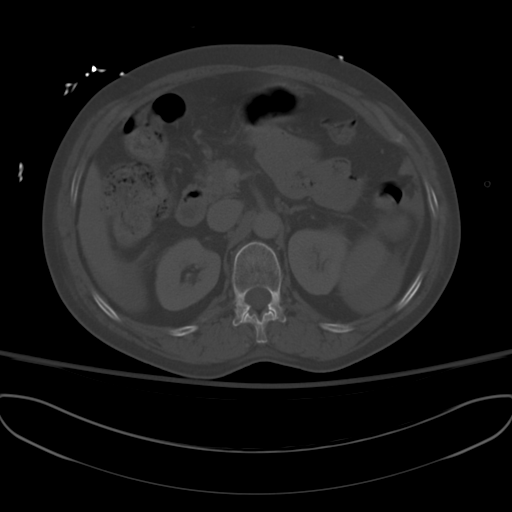
[im 72/100  soft-tissue]
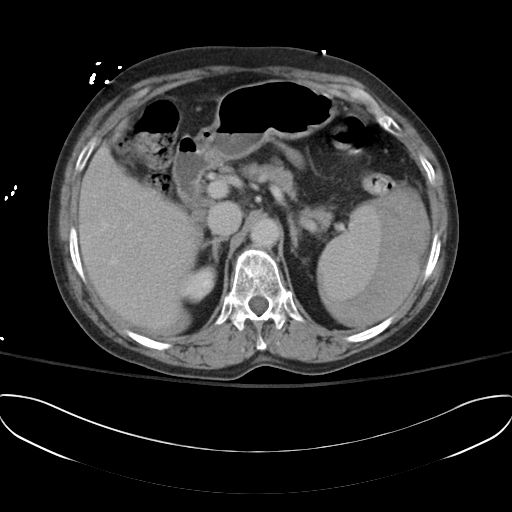
[im 78/100  soft-tissue]
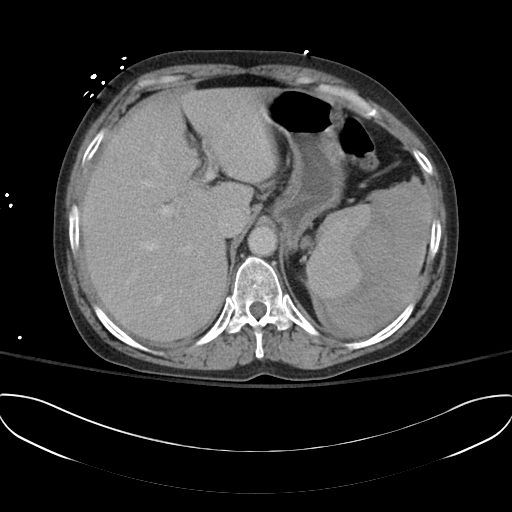
[im 78/100  lung]
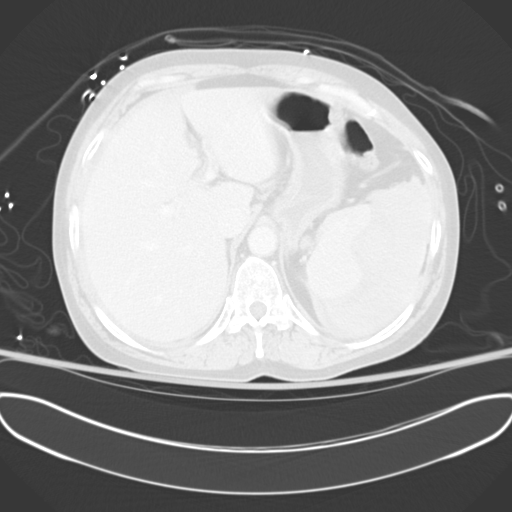
[im 83/100  lung]
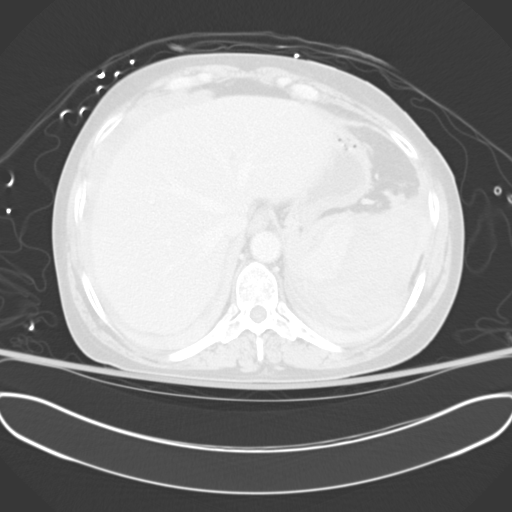
[im 89/100  soft-tissue]
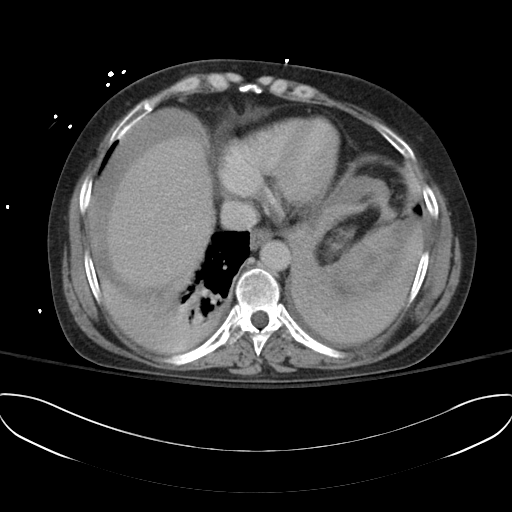
[im 89/100  lung]
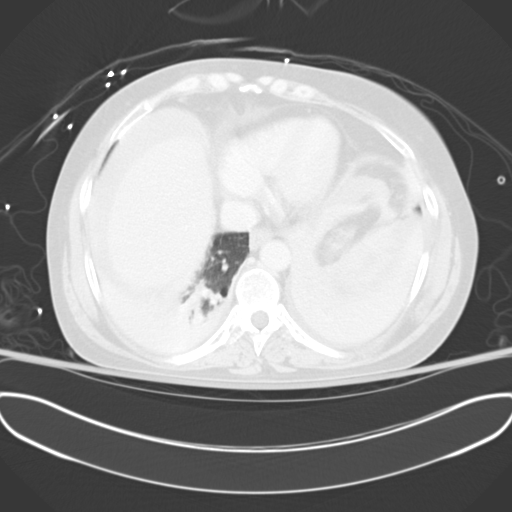
[im 94/100  soft-tissue]
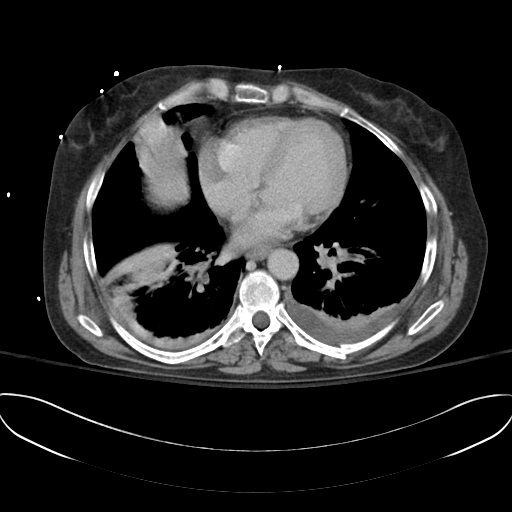
[im 94/100  lung]
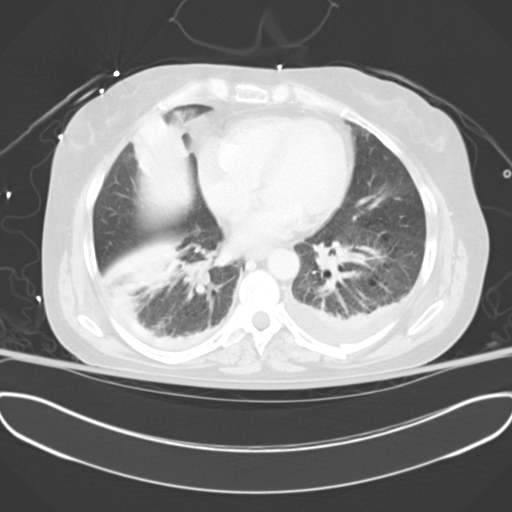

[14 of 32 positions shown; findings below may reference images not displayed]

FINDINGS: Lung bases again demonstrate bilateral pleural effusions and
bibasilar atelectatic changes. Diffuse areas of mucous plugging are
again noted as well.

The liver is within normal limits. The gallbladder has been
surgically removed. The spleen demonstrates evidence of a large
perisplenic hematoma as well as splenic laceration along the lateral
aspect of the spleen. The defect from the laceration measures
approximately 3.5 cm in length. The perisplenic hematoma measures
approximately 12 by 3.6 cm. The majority of the spleen appears
intact within normal enhancement pattern. No definitive areas of
acute hemorrhage are seen. There is free fluid within the abdomen as
well as within the pelvis consistent with a small amount of
intraperitoneal hemorrhage. The adrenal glands, kidneys and pancreas
are within normal limits.

Aortoiliac calcifications are noted. The bladder is distended with
opacified and non-opacified urine. No pelvic mass lesion is seen.
The appendix is within normal limits. No inflammatory changes are
seen. The osseous structures show fractures of the seventh and
eighth ribs on the left posteriorly. This is similar to that seen on
recent CT examination. No other focal bony abnormality is noted.
IMPRESSION: Left rib fractures with associated splenic laceration and
perisplenic hematoma. A small amount of free fluid is noted within
the abdomen consistent with hemorrhage. No areas of active
extravasation are identified.

Bibasilar changes similar to that seen on recent CT.

## 2015-10-26 IMAGING — CR DG ABDOMEN 1V
1 series · 2 of 2 positions shown · non-contrast
Comparison: CT, 10/31/2014.

CLINICAL DATA: Evaluate orogastric tube placement.

EXAM:
ABDOMEN - 1 VIEW

[Series 1: ap · 0.17mm/px · 2 of 2 slices shown]
[im 1/2]
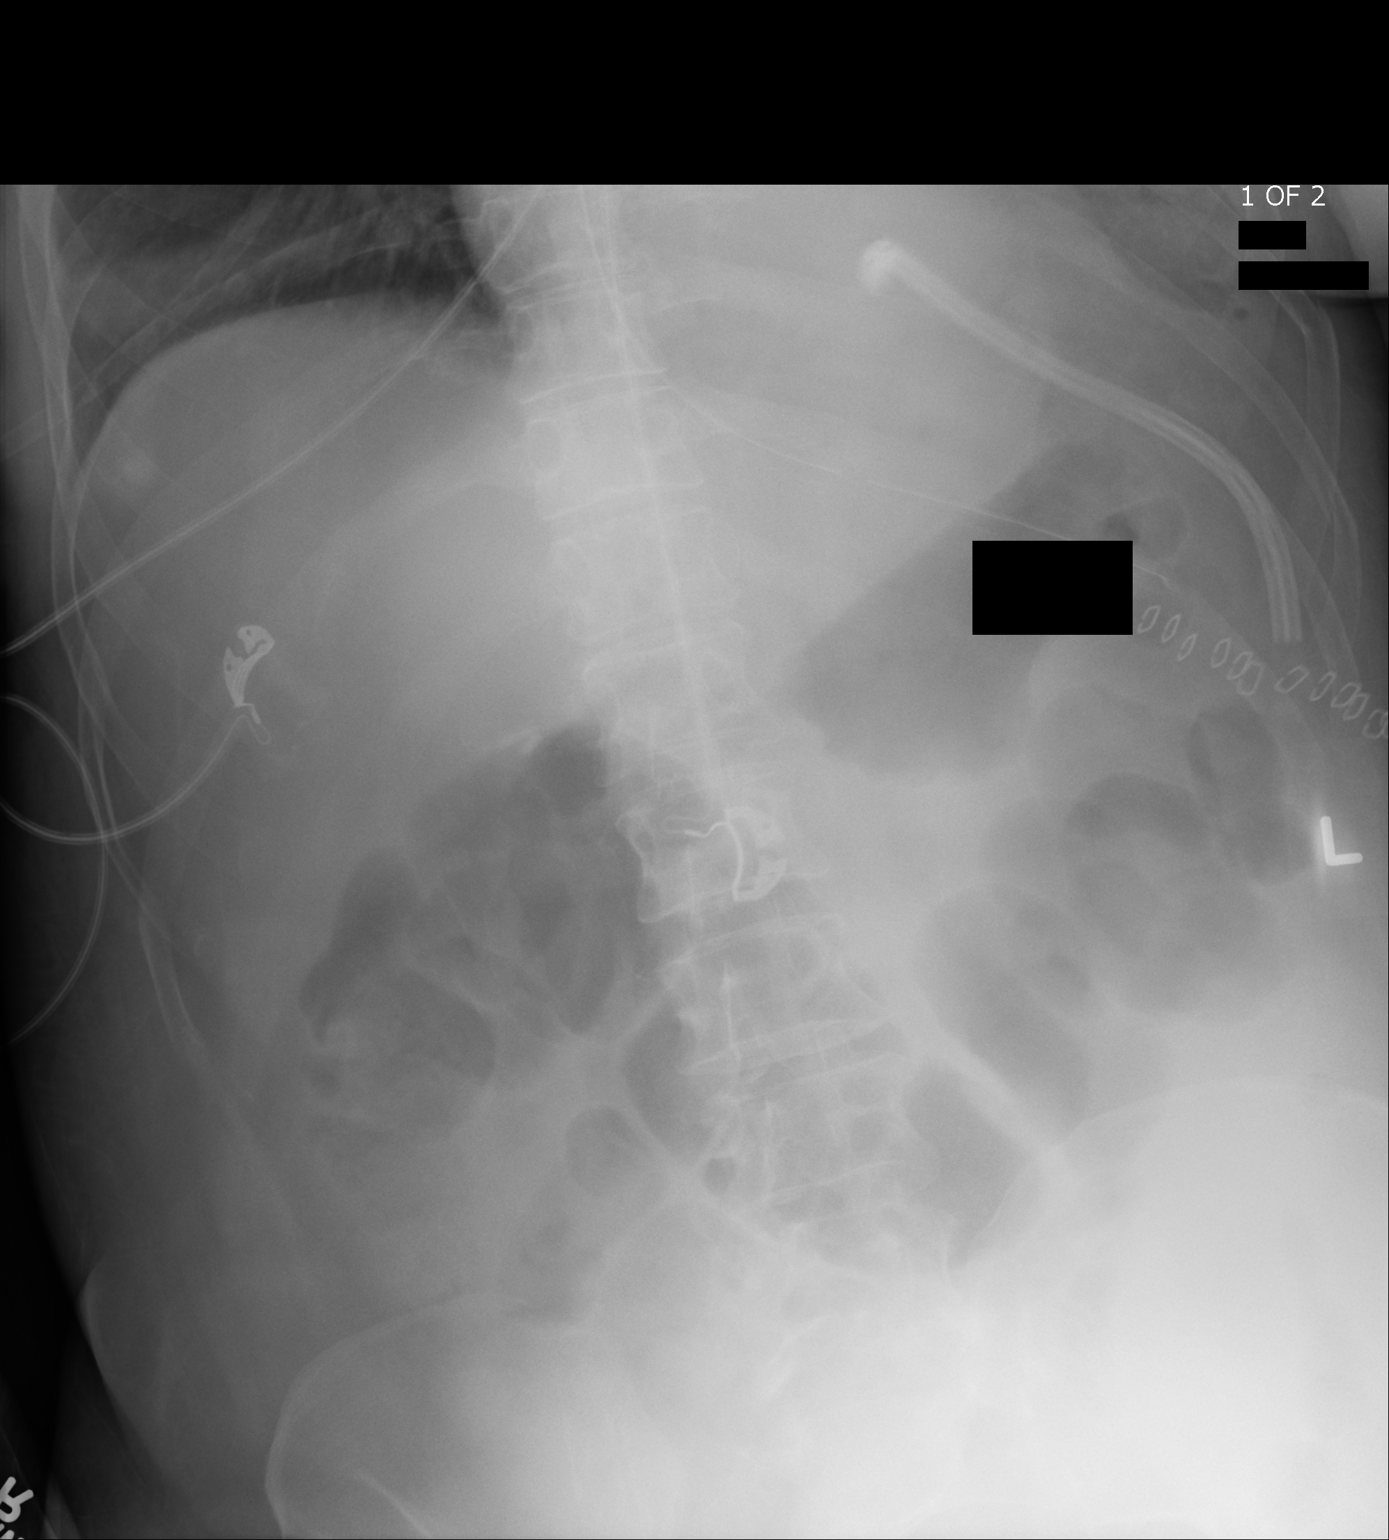
[im 2/2]
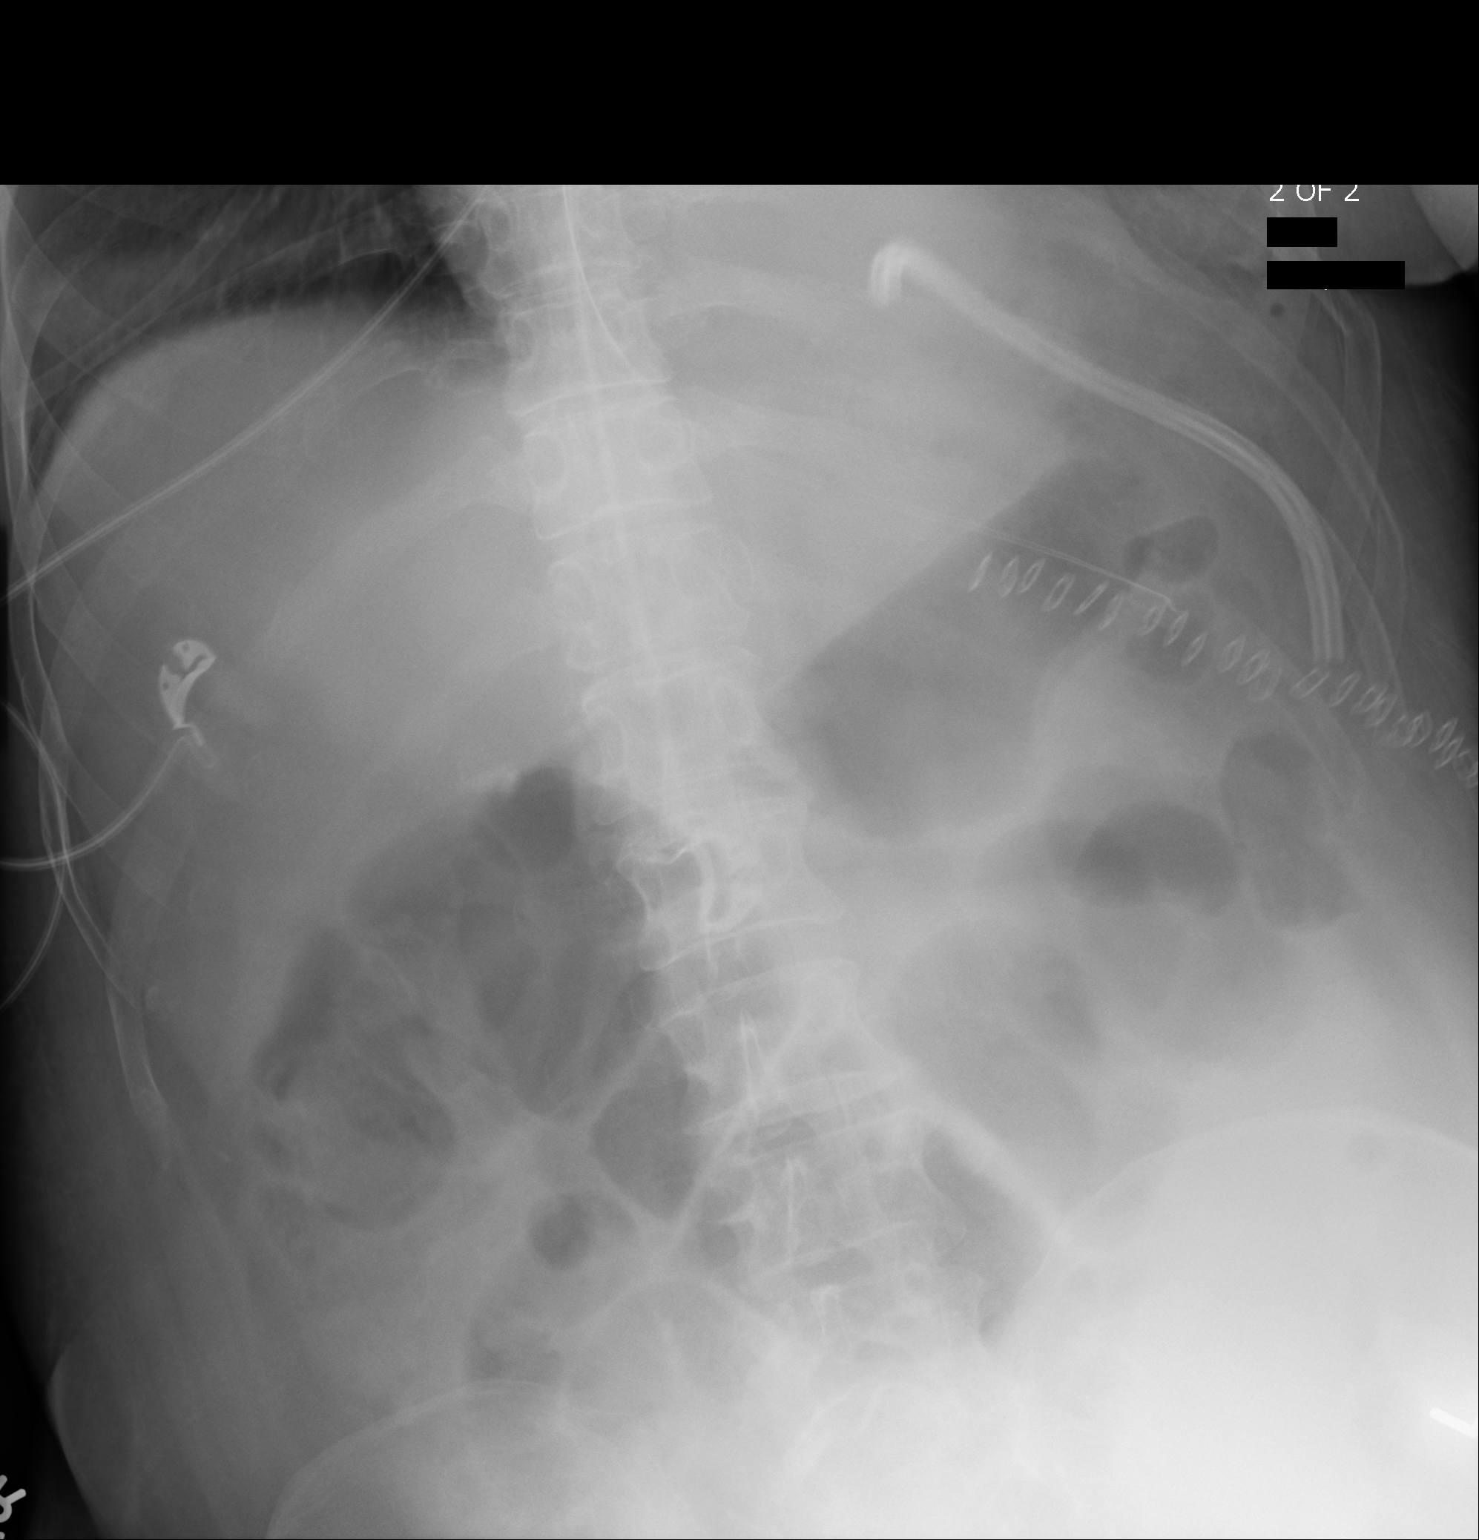

[2 of 2 positions shown; findings below may reference images not displayed]

FINDINGS: Orogastric tube passes below the diaphragm well into the stomach,
well positioned.

Surgical clips in left upper quadrant noted with surgical drain that
also projects in left upper quadrant.

Bowel gas pattern is unremarkable.
IMPRESSION: Orogastric tube is well positioned, passing well within the stomach.

## 2016-03-01 ENCOUNTER — Other Ambulatory Visit: Payer: Self-pay | Admitting: Family Medicine

## 2016-03-01 DIAGNOSIS — Z1231 Encounter for screening mammogram for malignant neoplasm of breast: Secondary | ICD-10-CM

## 2016-09-22 ENCOUNTER — Emergency Department: Payer: No Typology Code available for payment source

## 2016-09-22 ENCOUNTER — Inpatient Hospital Stay
Admission: EM | Admit: 2016-09-22 | Discharge: 2016-09-24 | DRG: 493 | Disposition: A | Discharge status: Home Health | Payer: No Typology Code available for payment source | Attending: Surgery | Admitting: Surgery

## 2016-09-22 DIAGNOSIS — J449 Chronic obstructive pulmonary disease, unspecified: Secondary | ICD-10-CM | POA: Diagnosis present

## 2016-09-22 DIAGNOSIS — E785 Hyperlipidemia, unspecified: Secondary | ICD-10-CM | POA: Diagnosis present

## 2016-09-22 DIAGNOSIS — Z9081 Acquired absence of spleen: Secondary | ICD-10-CM

## 2016-09-22 DIAGNOSIS — E7800 Pure hypercholesterolemia, unspecified: Secondary | ICD-10-CM | POA: Diagnosis present

## 2016-09-22 DIAGNOSIS — Y92099 Unspecified place in other non-institutional residence as the place of occurrence of the external cause: Secondary | ICD-10-CM

## 2016-09-22 DIAGNOSIS — F17210 Nicotine dependence, cigarettes, uncomplicated: Secondary | ICD-10-CM | POA: Diagnosis present

## 2016-09-22 DIAGNOSIS — Z7982 Long term (current) use of aspirin: Secondary | ICD-10-CM

## 2016-09-22 DIAGNOSIS — S82391A Other fracture of lower end of right tibia, initial encounter for closed fracture: Secondary | ICD-10-CM | POA: Diagnosis present

## 2016-09-22 DIAGNOSIS — Z79899 Other long term (current) drug therapy: Secondary | ICD-10-CM | POA: Diagnosis not present

## 2016-09-22 DIAGNOSIS — Z801 Family history of malignant neoplasm of trachea, bronchus and lung: Secondary | ICD-10-CM | POA: Diagnosis not present

## 2016-09-22 DIAGNOSIS — W108XXA Fall (on) (from) other stairs and steps, initial encounter: Secondary | ICD-10-CM | POA: Diagnosis present

## 2016-09-22 DIAGNOSIS — F411 Generalized anxiety disorder: Secondary | ICD-10-CM | POA: Diagnosis present

## 2016-09-22 DIAGNOSIS — S2231XA Fracture of one rib, right side, initial encounter for closed fracture: Secondary | ICD-10-CM

## 2016-09-22 DIAGNOSIS — Z800 Family history of malignant neoplasm of digestive organs: Secondary | ICD-10-CM | POA: Diagnosis not present

## 2016-09-22 DIAGNOSIS — K219 Gastro-esophageal reflux disease without esophagitis: Secondary | ICD-10-CM | POA: Diagnosis present

## 2016-09-22 DIAGNOSIS — S82891A Other fracture of right lower leg, initial encounter for closed fracture: Secondary | ICD-10-CM | POA: Diagnosis present

## 2016-09-22 DIAGNOSIS — Z419 Encounter for procedure for purposes other than remedying health state, unspecified: Secondary | ICD-10-CM

## 2016-09-22 DIAGNOSIS — F329 Major depressive disorder, single episode, unspecified: Secondary | ICD-10-CM | POA: Diagnosis present

## 2016-09-22 DIAGNOSIS — S82831A Other fracture of upper and lower end of right fibula, initial encounter for closed fracture: Secondary | ICD-10-CM | POA: Diagnosis present

## 2016-09-22 LAB — CBC WITH DIFFERENTIAL/PLATELET
BASOS PCT: 0 %
Basophils Absolute: 0 10*3/uL (ref 0–0.1)
EOS ABS: 0.1 10*3/uL (ref 0–0.7)
EOS PCT: 0 %
HEMATOCRIT: 40.7 % (ref 35.0–47.0)
Hemoglobin: 13.5 g/dL (ref 12.0–16.0)
LYMPHS ABS: 3.3 10*3/uL (ref 1.0–3.6)
Lymphocytes Relative: 13 %
MCH: 30.8 pg (ref 26.0–34.0)
MCHC: 33.3 g/dL (ref 32.0–36.0)
MCV: 92.3 fL (ref 80.0–100.0)
MONOS PCT: 6 %
Monocytes Absolute: 1.4 10*3/uL — ABNORMAL HIGH (ref 0.2–0.9)
NEUTROS PCT: 81 %
Neutro Abs: 20.5 10*3/uL — ABNORMAL HIGH (ref 1.4–6.5)
Platelets: 436 10*3/uL (ref 150–440)
RBC: 4.41 MIL/uL (ref 3.80–5.20)
RDW: 13.3 % (ref 11.5–14.5)
WBC: 25.4 10*3/uL — ABNORMAL HIGH (ref 3.6–11.0)

## 2016-09-22 LAB — BASIC METABOLIC PANEL
Anion gap: 7 (ref 5–15)
BUN: 8 mg/dL (ref 6–20)
CALCIUM: 9.3 mg/dL (ref 8.9–10.3)
CO2: 28 mmol/L (ref 22–32)
CREATININE: 0.69 mg/dL (ref 0.44–1.00)
Chloride: 101 mmol/L (ref 101–111)
GFR calc non Af Amer: >60 mL/min (ref >60)
Glucose, Bld: 107 mg/dL — ABNORMAL HIGH (ref 65–99)
Potassium: 4.2 mmol/L (ref 3.5–5.1)
SODIUM: 136 mmol/L (ref 135–145)

## 2016-09-22 MED ORDER — MOMETASONE FURO-FORMOTEROL FUM 100-5 MCG/ACT IN AERO
2.0000 | INHALATION_SPRAY | Freq: Two times a day (BID) | RESPIRATORY_TRACT | Status: DC
  Administered 2016-09-23: 2 via RESPIRATORY_TRACT
  Filled 2016-09-22: qty 8.8

## 2016-09-22 MED ORDER — ASPIRIN EC 81 MG PO TBEC
81.0000 mg | DELAYED_RELEASE_TABLET | Freq: Every day | ORAL | Status: DC
  Filled 2016-09-22: qty 1

## 2016-09-22 MED ORDER — ACETAMINOPHEN 650 MG RE SUPP: 650.0000 mg | Freq: Four times a day (QID) | RECTAL | Status: DC | PRN

## 2016-09-22 MED ORDER — BLISTEX MEDICATED EX OINT
TOPICAL_OINTMENT | CUTANEOUS | Status: DC | PRN
  Filled 2016-09-22: qty 6.3

## 2016-09-22 MED ORDER — KCL IN DEXTROSE-NACL 20-5-0.9 MEQ/L-%-% IV SOLN
INTRAVENOUS | Status: DC
  Administered 2016-09-23: via INTRAVENOUS
  Filled 2016-09-22 (×3): qty 1000

## 2016-09-22 MED ORDER — VENLAFAXINE HCL ER 75 MG PO CP24
75.0000 mg | ORAL_CAPSULE | Freq: Every day | ORAL | Status: DC
  Filled 2016-09-22: qty 1

## 2016-09-22 MED ORDER — ACETAMINOPHEN 325 MG PO TABS: 650.0000 mg | ORAL_TABLET | Freq: Four times a day (QID) | ORAL | Status: DC | PRN

## 2016-09-22 MED ORDER — TIOTROPIUM BROMIDE MONOHYDRATE 18 MCG IN CAPS
18.0000 ug | ORAL_CAPSULE | Freq: Every day | RESPIRATORY_TRACT | Status: DC
  Filled 2016-09-22: qty 5

## 2016-09-22 MED ORDER — HYDROCODONE-ACETAMINOPHEN 5-325 MG PO TABS
1.0000 | ORAL_TABLET | Freq: Once | ORAL | Status: AC
  Administered 2016-09-22: 1 via ORAL
  Filled 2016-09-22: qty 1

## 2016-09-22 MED ORDER — ONDANSETRON 4 MG PO TBDP
4.0000 mg | ORAL_TABLET | Freq: Once | ORAL | Status: AC
Start: 2016-09-22 — End: 2016-09-22
  Administered 2016-09-22: 4 mg via ORAL
  Filled 2016-09-22: qty 1

## 2016-09-22 MED ORDER — CLONAZEPAM 0.125 MG PO TBDP
0.5000 mg | ORAL_TABLET | Freq: Every day | ORAL | Status: DC
  Administered 2016-09-23: 0.5 mg via ORAL
  Filled 2016-09-22: qty 4

## 2016-09-22 MED ORDER — DEXTROSE 5 % IV SOLN
2.0000 g | INTRAVENOUS | Status: AC
  Administered 2016-09-23: 2 g via INTRAVENOUS
  Filled 2016-09-22 (×2): qty 2000

## 2016-09-22 MED ORDER — HYDROMORPHONE HCL 1 MG/ML IJ SOLN
0.5000 mg | INTRAMUSCULAR | Status: DC | PRN
  Administered 2016-09-23 (×2): 1 mg via INTRAVENOUS
  Filled 2016-09-22 (×2): qty 1

## 2016-09-22 MED ORDER — ALBUTEROL SULFATE (2.5 MG/3ML) 0.083% IN NEBU: 3.0000 mL | INHALATION_SOLUTION | Freq: Four times a day (QID) | RESPIRATORY_TRACT | Status: DC | PRN

## 2016-09-22 MED ORDER — BISACODYL 10 MG RE SUPP: 10.0000 mg | Freq: Every day | RECTAL | Status: DC | PRN

## 2016-09-22 MED ORDER — OXYBUTYNIN CHLORIDE 5 MG PO TABS: 5.0000 mg | ORAL_TABLET | Freq: Three times a day (TID) | ORAL | Status: DC | PRN

## 2016-09-22 MED ORDER — PANTOPRAZOLE SODIUM 40 MG PO TBEC
40.0000 mg | DELAYED_RELEASE_TABLET | Freq: Every day | ORAL | Status: DC
  Administered 2016-09-24: 40 mg via ORAL
  Filled 2016-09-22: qty 1

## 2016-09-22 MED ORDER — ONDANSETRON 4 MG PO TBDP
4.0000 mg | ORAL_TABLET | Freq: Three times a day (TID) | ORAL | Status: DC | PRN
  Administered 2016-09-23: 4 mg via ORAL
  Filled 2016-09-22 (×3): qty 1

## 2016-09-22 MED ORDER — OXYCODONE HCL 5 MG PO TABS: 5.0000 mg | ORAL_TABLET | ORAL | Status: DC | PRN

## 2016-09-22 MED ORDER — PANTOPRAZOLE SODIUM 40 MG IV SOLR
40.0000 mg | Freq: Every day | INTRAVENOUS | Status: DC
  Administered 2016-09-23: 40 mg via INTRAVENOUS
  Filled 2016-09-22: qty 40

## 2016-09-22 MED ORDER — CEFAZOLIN SODIUM-DEXTROSE 2-4 GM/100ML-% IV SOLN: 2.0000 g | INTRAVENOUS | Status: DC

## 2016-09-22 MED ORDER — MAGNESIUM HYDROXIDE 400 MG/5ML PO SUSP: 30.0000 mL | Freq: Every day | ORAL | Status: DC | PRN

## 2016-09-22 MED ORDER — DOCUSATE SODIUM 100 MG PO CAPS
100.0000 mg | ORAL_CAPSULE | Freq: Two times a day (BID) | ORAL | Status: DC
  Administered 2016-09-23: 100 mg via ORAL
  Filled 2016-09-22: qty 1

## 2016-09-22 MED ORDER — SIMVASTATIN 20 MG PO TABS: 10.0000 mg | ORAL_TABLET | Freq: Every day | ORAL | Status: DC

## 2016-09-22 MED ORDER — FLEET ENEMA 7-19 GM/118ML RE ENEM: 1.0000 | ENEMA | Freq: Once | RECTAL | Status: DC | PRN

## 2016-09-22 NOTE — ED Provider Notes (Signed)
[facility] Emergency Department Provider Note  ____________________________________________  Time seen: Approximately 7:30 PM  I have reviewed the triage vital signs and the nursing notes.   HISTORY  Chief Complaint Fall    HPI Kelli Marks is a 56 y.o. female that presents to the emergency department with right foot pain and right rib pain after falling this evening. Patient states that she was walking down her steps when she missed a step and landed on her right rib cage and right elbow. She states it is painful on her right side with breathing. She has not been able to walk since fall. She denies hitting her head or losing consciousness. She had her spleen removed 2 years ago. She is able to take Vicodin but needs Zofran to help with the nausea that she gets from Vicodin. She denies headache, visual changes, shortness of breath, chest pain, nausea, vomiting, abdominal pain, numbness, tingling.   Past Medical History:  Diagnosis Date  . Hypercholesteremia   . Hyperlipidemia   . Rib fractures     Patient Active Problem List   Diagnosis Date Noted  . Closed right ankle fracture 09/22/2016  . Cigarette smoker 11/27/2014  . Clinical depression 11/27/2014  . Acid reflux 11/27/2014  . Cannot sleep 11/27/2014  . 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 Acute respiratory failure ([place]) 10/31/2014  . Splenic laceration 10/31/2014  . Anemia 10/31/2014  . Hyponatremia 10/31/2014  . Essential hypertriglyceridemia 02/26/2014  . Anxiety, generalized 04/10/2013    Past Surgical History:  Procedure Laterality Date  . ABDOMINAL HYSTERECTOMY    . ANKLE SURGERY Left   . CHOLECYSTECTOMY    . 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 TONSILLECTOMY      Prior to Admission medications   Medication Sig Start Date End Date Taking? Authorizing Provider   albuterol (PROVENTIL HFA;VENTOLIN HFA) 108 (90 BASE) MCG/ACT inhaler Inhale 2 puffs into the lungs every 6 (six) hours as needed for wheezing or shortness of breath. Patient not taking: Reported on 11/27/2014 11/09/14   Loletha Grayer, MD  aspirin EC 81 MG tablet Take 81 mg by mouth daily.    Historical Provider, MD  clonazePAM (KLONOPIN) 0.5 MG tablet TAKE 1/2 TO 1 TABLET BY MOUTH EVERY DAY ONLY AS NEEDED FOR ANXIETY 08/22/14   Historical Provider, MD  ferrous sulfate 325 (65 FE) MG tablet Take 1 tablet (325 mg total) by mouth daily with breakfast. 11/09/14   Dia Crawford III, MD  mometasone-formoterol [facility]) 100-5 MCG/ACT AERO Inhale 2 puffs into the lungs 2 (two) times daily. Patient not taking: Reported on 11/27/2014 11/09/14   Loletha Grayer, MD  ondansetron [facility]) 4 MG tablet Take 1 tablet (4 mg total) by mouth daily as needed for nausea or vomiting. 10/29/14   Earleen Newport, MD  oxybutynin (DITROPAN) 5 MG tablet Take 5 mg by mouth 2 (two) times daily.    Historical Provider, MD  oxyCODONE-acetaminophen (PERCOCET/ROXICET) 5-325 MG per tablet Take 1-2 tablets by mouth every 6 (six) hours as needed for severe pain. 11/12/14   Marlyce Huge, MD  ranitidine (ZANTAC) 150 MG tablet Take 150 mg by mouth daily.    Historical Provider, MD  simvastatin (ZOCOR) 10 MG tablet Take 10 mg by mouth at bedtime.     Historical Provider, MD  tiotropium (SPIRIVA) 18 MCG inhalation capsule Place 1 capsule (18 mcg total) into inhaler and inhale  daily. Patient not taking: Reported on 11/27/2014 11/09/14   Loletha Grayer, MD  venlafaxine XR (EFFEXOR-XR) 75 MG 24 hr capsule Take by mouth. 11/18/14 02/16/15  Historical Provider, MD    Allergies Patient has no known allergies.  Family History  Problem Relation Age of Onset  . Rectal cancer Mother   . Lung cancer Father     Social History Social History  Substance Use Topics  . Smoking status: Current Every Day Smoker    Packs/day: 1.00    Years: 30.00     Types: Cigarettes  . Smokeless tobacco: Never Used  . Alcohol use No     Review of Systems  Constitutional: No fever/chills Cardiovascular: No chest pain. Respiratory: No SOB. Gastrointestinal: No abdominal pain.  No nausea, no vomiting.  Musculoskeletal: Positive for right rib pain, right elbow pain, right ankle pain. Skin: Negative for rash. Positive for right elbow abrasion. Positive for right ankle ecchymosis. Neurological: Negative for headaches, numbness or tingling   ____________________________________________   PHYSICAL EXAM:  VITAL SIGNS: ED Triage Vitals  Enc Vitals Group     BP 09/22/16 1828 (!) 147/75     Pulse Rate 09/22/16 1828 89     Resp 09/22/16 1828 20     Temp 09/22/16 1828 98.3 F (36.8 C)     Temp Source 09/22/16 1828 Oral     SpO2 09/22/16 1828 99 %     Weight 09/22/16 1825 136 lb (61.7 kg)     Height 09/22/16 1825 5\' 7"  (1.702 m)     Head Circumference --      Peak Flow --      Pain Score 09/22/16 1825 8     Pain Loc --      Pain Edu? --      Excl. in [place]? --      Constitutional: Alert and oriented. Well appearing and in no acute distress. Eyes: Conjunctivae are normal. PERRL. EOMI. Head: Atraumatic. ENT:      Ears:      Nose: No congestion/rhinnorhea.      Mouth/Throat: Mucous membranes are moist.  Neck: No stridor. No cervical spine tenderness to palpation. Cardiovascular: Normal rate, regular rhythm.  Good peripheral circulation. Respiratory: Normal respiratory effort without tachypnea or retractions. Lungs CTAB. Good air entry to the bases with no decreased or absent breath sounds. Gastrointestinal: Bowel sounds 4 quadrants. Soft and nontender to palpation. No guarding or rigidity. No palpable masses. No distention.  Musculoskeletal: Full range of motion to all extremities. Tender to palpation over lower right rib cage. Significant bruising and swelling over medial and lateral malleolus. Neurologic:  Normal speech and language. No  gross focal neurologic deficits are appreciated.  Skin:  Skin is warm, dry. 1 cm abrasion to right elbow.     ____________________________________________   LABS (all labs ordered are listed, but only abnormal results are displayed)  Labs Reviewed  BASIC METABOLIC PANEL - Abnormal; Notable for the following:       Result Value   Glucose, Bld 107 (*)    All other components within normal limits  CBC WITH DIFFERENTIAL/PLATELET - Abnormal; Notable for the following:    WBC 25.4 (*)    Neutro Abs 20.5 (*)    Monocytes Absolute 1.4 (*)    All other components within normal limits  SURGICAL PCR SCREEN  MRSA PCR SCREENING   ____________________________________________  EKG   ____________________________________________  RADIOLOGY Robinette Haines, personally viewed and evaluated these images (plain radiographs) as part of  my medical decision making, as well as reviewing the written report by the radiologist.  Dg Ribs Unilateral W/chest Right  Result Date: 09/22/2016 CLINICAL DATA:  [place] on this steps at home.  Right-sided chest pain. EXAM: RIGHT RIBS AND CHEST - 3+ VIEW COMPARISON:  11/04/2014 FINDINGS: Heart size is normal. Mediastinal shadows are normal. The lungs are clear except for chronic pleural and parenchymal scarring at the apices. No pneumothorax or hemothorax. Right rib films show a minimally displaced fracture of the posterior right fifth rib. I think there are old healed rib fractures laterally in the lower ribcage. IMPRESSION: No active cardiopulmonary disease. Mildly displaced right posterior fifth rib fracture. Electronically Signed   By: Nelson Chimes M.D.   On: 09/22/2016 20:20   Dg Ankle Complete Right  Result Date: 09/22/2016 CLINICAL DATA:  Slipped and fell on this steps at home. Pain and deformity. EXAM: RIGHT ANKLE - COMPLETE 3+ VIEW COMPARISON:  None. FINDINGS: Comminuted fracture of the distal fibula 3 cm above the ankle joint. Slight medial angulation.  Probable impaction fracture of the adjacent cortex of the lateral tibia. Nondisplaced intra-articular fracture of the distal tibia at least anteriorly. No fracture of the talus or hindfoot identified. Some sort of radio dense foreign object is present in the soft tissues medial to the midfoot. IMPRESSION: Comminuted fracture of the distal fibula with slight medial angulation. Probable lateral cortical impaction fracture of the tibial metaphysis. Fracture of the distal tibia at least partly intraarticular. Some sort of radiopaque foreign object within the soft tissues medial to the midfoot. Electronically Signed   By: Nelson Chimes M.D.   On: 09/22/2016 20:16   Ct Ankle Right Wo Contrast  Result Date: 09/22/2016 CLINICAL DATA:  Slip and fall injury. Right ankle fracture on plain films. EXAM: CT OF THE RIGHT ANKLE WITHOUT CONTRAST TECHNIQUE: Multidetector CT imaging of the right ankle was performed according to the standard protocol. Multiplanar CT image reconstructions were also generated. COMPARISON:  Right ankle radiographs 09/22/2016 FINDINGS: Bones/Joint/Cartilage Right tibia demonstrates multiple comminuted fractures of the distal tibial metaphysis with fracture lines extending to the lateral, medial, and posterior tibiotalar joint. There is impaction of the fracture fragments with lateral cortical buckling and lateral angulation of the distal fracture fragments. Mildly displaced posterior malleolar fragment. Mildly displaced anterior malleolar fragment. Nondisplaced medial malleolar fragment. Tiny bone fragments consistent with avulsion fragments inferior to the medial malleolus. Right fibula demonstrates transverse comminuted fracture of the distal shaft with buckling and impaction of the fracture fragments. There is about 11 mm impaction of the distal fragment into the proximal fragment, resulting in bone shortening. There is mild posterior displacement and lateral angulation of the distal fracture  fragments. Additionally, there are small bone ossicles consistent with avulsion fragments inferior to the lateral malleolus. Tiny bone fragments are demonstrated within the tibia fibular joint space. The talus, calcaneus, and visualized tarsal bones appear intact. There is an old appearing ununited ossicle adjacent to the distal calcaneus laterally consistent with an accessory ossicle in the peroneal tendon. Ligaments Suboptimally assessed by CT. Muscles and Tendons Limited visualization. Anterior and posterior and peroneal ligaments appear grossly intact. Soft tissues Diffuse soft tissue swelling and edema throughout the ankle. IMPRESSION: Comminuted and impacted fractures demonstrated in the distal right tibia and fibula with lateral angulation of the distal fracture fragments. Intraarticular extension to the tibiotalar joint is identified. Small osseous fragments demonstrated within the tibiotalar joint space. Additional avulsion fragments are demonstrated off of the medial and lateral malleolus. Diffuse  soft tissue swelling. Electronically Signed   By: Lucienne Capers M.D.   On: 09/22/2016 22:10    ____________________________________________    PROCEDURES  Procedure(s) performed:    Procedures    Medications  albuterol (PROVENTIL) (2.5 MG/3ML) 0.083% nebulizer solution 3 mL (not administered)  clonazepam (KLONOPIN) disintegrating tablet 0.5 mg (not administered)  mometasone-formoterol (DULERA) 100-5 MCG/ACT inhaler 2 puff (not administered)  tiotropium (SPIRIVA) inhalation capsule 18 mcg (not administered)  venlafaxine XR (EFFEXOR-XR) 24 hr capsule 75 mg (not administered)  simvastatin (ZOCOR) tablet 10 mg (not administered)  oxybutynin (DITROPAN) tablet 5 mg (not administered)  pantoprazole (PROTONIX) EC tablet 40 mg (not administered)  aspirin EC tablet 81 mg (not administered)  ondansetron (ZOFRAN-ODT) disintegrating tablet 4 mg (not administered)  dextrose 5 % and 0.9 % NaCl with  KCl 20 mEq/L infusion (not administered)  acetaminophen (TYLENOL) tablet 650 mg (not administered)    Or  acetaminophen (TYLENOL) suppository 650 mg (not administered)  HYDROmorphone (DILAUDID) injection 0.5-1 mg (not administered)  docusate sodium (COLACE) capsule 100 mg (not administered)  magnesium hydroxide (MILK OF MAGNESIA) suspension 30 mL (not administered)  bisacodyl (DULCOLAX) suppository 10 mg (not administered)  sodium phosphate (FLEET) 7-19 GM/118ML enema 1 enema (not administered)  pantoprazole (PROTONIX) injection 40 mg (not administered)  oxyCODONE (Oxy IR/ROXICODONE) immediate release tablet 5-10 mg (not administered)  ceFAZolin (ANCEF) 2 g in dextrose 5 % 100 mL IVPB (not administered)  lip balm (BLISTEX) ointment (not administered)  HYDROcodone-acetaminophen (NORCO/VICODIN) 5-325 MG per tablet 1 tablet (1 tablet Oral Given 09/22/16 2029)  ondansetron (ZOFRAN-ODT) disintegrating tablet 4 mg (4 mg Oral Given 09/22/16 2029)     ____________________________________________   INITIAL IMPRESSION / ASSESSMENT AND PLAN / ED COURSE  Pertinent labs & imaging results that were available during my care of the patient were reviewed by me and considered in my medical decision making (see chart for details).  Review of the [place] CSRS was performed in accordance of the [place] prior to dispensing any controlled drugs.   Patient's diagnosis is consistent with closed fracture of right ankle and closed right rib fracture. Vital signs and exam are reassuring. Posterior fifth rib fracture on chest x-ray per radiology. Fracture of distal fibula and tibia on ankle x-ray. Patient is neurovascularly intact. She was given Vicodin and Zofran. After speaking with Dr. Roland Rack, he recommended that patient be admitted for surgery tomorrow. Ankle CT was ordered and ankle was placed in posterior splint.   ____________________________________________  FINAL CLINICAL IMPRESSION(S) / ED DIAGNOSES  Final  diagnoses:  Closed fracture of right ankle, initial encounter  Closed fracture of one rib of right side, initial encounter      NEW MEDICATIONS STARTED DURING THIS VISIT:  Current Discharge Medication List          This chart was dictated using voice recognition software/Dragon. Despite best efforts to proofread, errors can occur which can change the meaning. Any change was purely unintentional.    Laban Emperor, PA-C 09/22/16 2358    Laban Emperor, PA-C 09/22/16 2359    Orbie Pyo, MD 09/23/16 0000

## 2016-09-22 NOTE — ED Notes (Signed)
Patient reports she was walking down steps and missed a step. Pt c/o pain to right ankle, right ribcage - posterior, lateral and anterior, and right elbow. Pt has severe edema/ecchymosis to right ankle. Pt has slight bruising/abrasion to right elbow.

## 2016-09-22 NOTE — ED Triage Notes (Signed)
Pt states she slipped and fell on her steps at home. Pt c/o right ankle, right shoulder and right rib pain.Marland Kitchen

## 2016-09-23 ENCOUNTER — Inpatient Hospital Stay: Payer: No Typology Code available for payment source

## 2016-09-23 ENCOUNTER — Encounter
Admission: EM | Disposition: A | Discharge status: Home Health | Payer: Self-pay | Source: Home / Self Care | Attending: Surgery

## 2016-09-23 ENCOUNTER — Encounter: Payer: Self-pay | Admitting: *Deleted

## 2016-09-23 ENCOUNTER — Inpatient Hospital Stay: Payer: No Typology Code available for payment source | Admitting: Anesthesiology

## 2016-09-23 HISTORY — PX: ORIF ANKLE FRACTURE: SHX5408

## 2016-09-23 LAB — SURGICAL PCR SCREEN
MRSA, PCR: NEGATIVE
STAPHYLOCOCCUS AUREUS: NEGATIVE

## 2016-09-23 SURGERY — OPEN REDUCTION INTERNAL FIXATION (ORIF) ANKLE FRACTURE
Anesthesia: Spinal | Site: Ankle | Laterality: Right | Wound class: Clean

## 2016-09-23 SURGERY — OPEN REDUCTION INTERNAL FIXATION (ORIF) ANKLE FRACTURE
Anesthesia: Choice | Laterality: Right

## 2016-09-23 MED ORDER — KCL IN DEXTROSE-NACL 20-5-0.9 MEQ/L-%-% IV SOLN
INTRAVENOUS | Status: DC
  Administered 2016-09-23 – 2016-09-24 (×2): via INTRAVENOUS
  Filled 2016-09-23 (×3): qty 1000

## 2016-09-23 MED ORDER — PROMETHAZINE HCL 25 MG/ML IJ SOLN
6.2500 mg | Freq: Once | INTRAMUSCULAR | Status: AC
  Administered 2016-09-23: 6.25 mg via INTRAVENOUS

## 2016-09-23 MED ORDER — METOCLOPRAMIDE HCL 5 MG/ML IJ SOLN: 5.0000 mg | Freq: Three times a day (TID) | INTRAMUSCULAR | Status: DC | PRN

## 2016-09-23 MED ORDER — ACETAMINOPHEN 325 MG PO TABS: 650.0000 mg | ORAL_TABLET | Freq: Four times a day (QID) | ORAL | Status: DC | PRN

## 2016-09-23 MED ORDER — CEFAZOLIN SODIUM-DEXTROSE 2-4 GM/100ML-% IV SOLN
2.0000 g | Freq: Four times a day (QID) | INTRAVENOUS | Status: AC
  Administered 2016-09-23 – 2016-09-24 (×3): 2 g via INTRAVENOUS
  Filled 2016-09-23 (×7): qty 100

## 2016-09-23 MED ORDER — CLONAZEPAM 0.5 MG PO TABS
0.5000 mg | ORAL_TABLET | Freq: Every day | ORAL | Status: DC
  Administered 2016-09-23: 0.5 mg via ORAL
  Filled 2016-09-23: qty 1

## 2016-09-23 MED ORDER — VENLAFAXINE HCL ER 75 MG PO CP24
225.0000 mg | ORAL_CAPSULE | Freq: Every day | ORAL | Status: DC
  Administered 2016-09-24: 225 mg via ORAL
  Filled 2016-09-23: qty 1

## 2016-09-23 MED ORDER — FENTANYL CITRATE (PF) 100 MCG/2ML IJ SOLN: 25.0000 ug | INTRAMUSCULAR | Status: DC | PRN

## 2016-09-23 MED ORDER — BUPIVACAINE HCL (PF) 0.5 % IJ SOLN
INTRAMUSCULAR | Status: DC | PRN
  Administered 2016-09-23: 2.5 mL

## 2016-09-23 MED ORDER — CEFAZOLIN SODIUM-DEXTROSE 2-4 GM/100ML-% IV SOLN
INTRAVENOUS | Status: AC
  Filled 2016-09-23: qty 100

## 2016-09-23 MED ORDER — ONDANSETRON HCL 4 MG/2ML IJ SOLN
4.0000 mg | Freq: Once | INTRAMUSCULAR | Status: AC | PRN
  Administered 2016-09-23: 4 mg via INTRAVENOUS

## 2016-09-23 MED ORDER — METOCLOPRAMIDE HCL 10 MG PO TABS: 5.0000 mg | ORAL_TABLET | Freq: Three times a day (TID) | ORAL | Status: DC | PRN

## 2016-09-23 MED ORDER — DIPHENHYDRAMINE HCL 12.5 MG/5ML PO ELIX: 12.5000 mg | ORAL_SOLUTION | ORAL | Status: DC | PRN

## 2016-09-23 MED ORDER — ONDANSETRON HCL 4 MG/2ML IJ SOLN
INTRAMUSCULAR | Status: AC
  Filled 2016-09-23: qty 2

## 2016-09-23 MED ORDER — KETOROLAC TROMETHAMINE 30 MG/ML IJ SOLN
INTRAMUSCULAR | Status: AC
  Administered 2016-09-23: 30 mg
  Filled 2016-09-23: qty 1

## 2016-09-23 MED ORDER — OXYCODONE HCL 5 MG PO TABS
5.0000 mg | ORAL_TABLET | ORAL | Status: DC | PRN
  Administered 2016-09-24: 5 mg via ORAL
  Filled 2016-09-23: qty 1

## 2016-09-23 MED ORDER — ONDANSETRON HCL 4 MG PO TABS: 4.0000 mg | ORAL_TABLET | Freq: Four times a day (QID) | ORAL | Status: DC | PRN

## 2016-09-23 MED ORDER — PROPOFOL 10 MG/ML IV BOLUS
INTRAVENOUS | Status: AC
  Filled 2016-09-23: qty 20

## 2016-09-23 MED ORDER — MAGNESIUM HYDROXIDE 400 MG/5ML PO SUSP: 30.0000 mL | Freq: Every day | ORAL | Status: DC | PRN

## 2016-09-23 MED ORDER — KETOROLAC TROMETHAMINE 15 MG/ML IJ SOLN
15.0000 mg | Freq: Once | INTRAMUSCULAR | Status: DC
  Filled 2016-09-23: qty 1

## 2016-09-23 MED ORDER — BUPIVACAINE HCL 0.5 % IJ SOLN
INTRAMUSCULAR | Status: DC | PRN
  Administered 2016-09-23: 30 mL

## 2016-09-23 MED ORDER — ONDANSETRON HCL 4 MG/2ML IJ SOLN: 4.0000 mg | Freq: Four times a day (QID) | INTRAMUSCULAR | Status: DC | PRN

## 2016-09-23 MED ORDER — DOCUSATE SODIUM 100 MG PO CAPS
100.0000 mg | ORAL_CAPSULE | Freq: Two times a day (BID) | ORAL | Status: DC
  Administered 2016-09-23 – 2016-09-24 (×2): 100 mg via ORAL
  Filled 2016-09-23 (×2): qty 1

## 2016-09-23 MED ORDER — OXYBUTYNIN CHLORIDE 5 MG PO TABS
5.0000 mg | ORAL_TABLET | Freq: Two times a day (BID) | ORAL | Status: DC
  Administered 2016-09-23 – 2016-09-24 (×2): 5 mg via ORAL
  Filled 2016-09-23 (×2): qty 1

## 2016-09-23 MED ORDER — FERROUS SULFATE 325 (65 FE) MG PO TABS
325.0000 mg | ORAL_TABLET | Freq: Every day | ORAL | Status: DC
  Administered 2016-09-24: 325 mg via ORAL
  Filled 2016-09-23: qty 1

## 2016-09-23 MED ORDER — MIDAZOLAM HCL 5 MG/5ML IJ SOLN
INTRAMUSCULAR | Status: DC | PRN
  Administered 2016-09-23: 1 mg via INTRAVENOUS
  Administered 2016-09-23 (×2): 0.5 mg via INTRAVENOUS

## 2016-09-23 MED ORDER — BISACODYL 10 MG RE SUPP: 10.0000 mg | Freq: Every day | RECTAL | Status: DC | PRN

## 2016-09-23 MED ORDER — KETOROLAC TROMETHAMINE 15 MG/ML IJ SOLN
7.5000 mg | Freq: Four times a day (QID) | INTRAMUSCULAR | Status: DC
  Administered 2016-09-23 – 2016-09-24 (×3): 7.5 mg via INTRAVENOUS
  Filled 2016-09-23 (×2): qty 1

## 2016-09-23 MED ORDER — PROMETHAZINE HCL 25 MG/ML IJ SOLN
INTRAMUSCULAR | Status: AC
  Filled 2016-09-23: qty 1

## 2016-09-23 MED ORDER — MIDAZOLAM HCL 2 MG/2ML IJ SOLN
INTRAMUSCULAR | Status: AC
  Filled 2016-09-23: qty 2

## 2016-09-23 MED ORDER — NEOMYCIN-POLYMYXIN B GU 40-200000 IR SOLN
Status: DC | PRN
  Administered 2016-09-23: 2 mL

## 2016-09-23 MED ORDER — LACTATED RINGERS IV SOLN
INTRAVENOUS | Status: DC
  Administered 2016-09-23 (×3): via INTRAVENOUS

## 2016-09-23 MED ORDER — ENOXAPARIN SODIUM 40 MG/0.4ML ~~LOC~~ SOLN
40.0000 mg | SUBCUTANEOUS | Status: DC
  Administered 2016-09-24: 40 mg via SUBCUTANEOUS
  Filled 2016-09-23: qty 0.4

## 2016-09-23 MED ORDER — ASPIRIN EC 81 MG PO TBEC
81.0000 mg | DELAYED_RELEASE_TABLET | Freq: Every day | ORAL | Status: DC
  Administered 2016-09-24: 81 mg via ORAL

## 2016-09-23 MED ORDER — NEOMYCIN-POLYMYXIN B GU 40-200000 IR SOLN
Status: AC
  Filled 2016-09-23: qty 4

## 2016-09-23 MED ORDER — FENTANYL CITRATE (PF) 100 MCG/2ML IJ SOLN
INTRAMUSCULAR | Status: DC | PRN
  Administered 2016-09-23: 50 ug via INTRAVENOUS
  Administered 2016-09-23 (×2): 25 ug via INTRAVENOUS

## 2016-09-23 MED ORDER — FLEET ENEMA 7-19 GM/118ML RE ENEM: 1.0000 | ENEMA | Freq: Once | RECTAL | Status: DC | PRN

## 2016-09-23 MED ORDER — FENTANYL CITRATE (PF) 100 MCG/2ML IJ SOLN
INTRAMUSCULAR | Status: AC
  Filled 2016-09-23: qty 2

## 2016-09-23 MED ORDER — SODIUM CHLORIDE 0.9 % IJ SOLN
INTRAMUSCULAR | Status: AC
  Filled 2016-09-23: qty 20

## 2016-09-23 MED ORDER — ACETAMINOPHEN 500 MG PO TABS
1000.0000 mg | ORAL_TABLET | Freq: Four times a day (QID) | ORAL | Status: DC
  Administered 2016-09-23 – 2016-09-24 (×2): 1000 mg via ORAL
  Filled 2016-09-23 (×3): qty 2

## 2016-09-23 MED ORDER — HYDROMORPHONE HCL 1 MG/ML IJ SOLN: 0.5000 mg | INTRAMUSCULAR | Status: DC | PRN

## 2016-09-23 MED ORDER — PROPOFOL 500 MG/50ML IV EMUL
INTRAVENOUS | Status: DC | PRN
  Administered 2016-09-23: 100 ug/kg/min via INTRAVENOUS

## 2016-09-23 MED ORDER — ACETAMINOPHEN 650 MG RE SUPP: 650.0000 mg | Freq: Four times a day (QID) | RECTAL | Status: DC | PRN

## 2016-09-23 MED ORDER — PROPOFOL 500 MG/50ML IV EMUL
INTRAVENOUS | Status: AC
  Filled 2016-09-23: qty 50

## 2016-09-23 MED ORDER — BUPIVACAINE HCL (PF) 0.5 % IJ SOLN
INTRAMUSCULAR | Status: AC
  Filled 2016-09-23: qty 30

## 2016-09-23 MED ORDER — SIMVASTATIN 10 MG PO TABS
10.0000 mg | ORAL_TABLET | Freq: Every day | ORAL | Status: DC
  Administered 2016-09-23: 10 mg via ORAL
  Filled 2016-09-23 (×3): qty 1

## 2016-09-23 SURGICAL SUPPLY — 63 items
BANDAGE ACE 4X5 VEL STRL LF (GAUZE/BANDAGES/DRESSINGS) ×6 IMPLANT
BANDAGE ACE 6X5 VEL STRL LF (GAUZE/BANDAGES/DRESSINGS) ×3 IMPLANT
BIT DRILL 2.5X2.75 QC CALB (BIT) ×3 IMPLANT
BIT DRILL CALIBRATED 2.7 (BIT) ×2 IMPLANT
BIT DRILL CALIBRATED 2.7MM (BIT) ×1
BLADE SURG SZ10 CARB STEEL (BLADE) ×6 IMPLANT
BNDG COHESIVE 4X5 TAN STRL (GAUZE/BANDAGES/DRESSINGS) ×3 IMPLANT
BNDG ESMARK 6X12 TAN STRL LF (GAUZE/BANDAGES/DRESSINGS) ×3 IMPLANT
BNDG PLASTER FAST 4X5 WHT LF (CAST SUPPLIES) ×12 IMPLANT
CANISTER SUCT 1200ML W/VALVE (MISCELLANEOUS) ×3 IMPLANT
CATH FOL LEG HOLDER (MISCELLANEOUS) ×3 IMPLANT
CHLORAPREP W/TINT 26ML (MISCELLANEOUS) ×6 IMPLANT
CUFF TOURN 18 STER (MISCELLANEOUS) ×3 IMPLANT
CUFF TOURN 24 STER (MISCELLANEOUS) IMPLANT
CUFF TOURN 30 STER DUAL PORT (MISCELLANEOUS) IMPLANT
DRAPE C-ARM XRAY 36X54 (DRAPES) ×3 IMPLANT
DRAPE C-ARMOR (DRAPES) ×3 IMPLANT
DRAPE INCISE IOBAN 66X45 STRL (DRAPES) ×3 IMPLANT
DRAPE SURG 17X11 SM STRL (DRAPES) ×3 IMPLANT
DRAPE U-SHAPE 47X51 STRL (DRAPES) ×3 IMPLANT
ELECT CAUTERY BLADE 6.4 (BLADE) ×3 IMPLANT
ELECT REM PT RETURN 9FT ADLT (ELECTROSURGICAL) ×3
ELECTRODE REM PT RTRN 9FT ADLT (ELECTROSURGICAL) ×1 IMPLANT
GAUZE PETRO XEROFOAM 1X8 (MISCELLANEOUS) ×3 IMPLANT
GAUZE SPONGE 4X4 12PLY STRL (GAUZE/BANDAGES/DRESSINGS) ×3 IMPLANT
GLOVE BIO SURGEON STRL SZ8 (GLOVE) ×6 IMPLANT
GLOVE INDICATOR 8.0 STRL GRN (GLOVE) ×3 IMPLANT
GOWN STRL REUS W/ TWL LRG LVL3 (GOWN DISPOSABLE) ×1 IMPLANT
GOWN STRL REUS W/ TWL XL LVL3 (GOWN DISPOSABLE) ×1 IMPLANT
GOWN STRL REUS W/TWL LRG LVL3 (GOWN DISPOSABLE) ×2
GOWN STRL REUS W/TWL XL LVL3 (GOWN DISPOSABLE) ×2
HEMOVAC 400ML (MISCELLANEOUS) ×3
K-WIRE ACE 1.6X6 (WIRE) ×6
KIT DRAIN HEMOVAC JP 7FR 400ML (MISCELLANEOUS) ×1 IMPLANT
KIT RM TURNOVER STRD PROC AR (KITS) ×3 IMPLANT
KWIRE ACE 1.6X6 (WIRE) ×2 IMPLANT
LABEL OR SOLS (LABEL) ×3 IMPLANT
NS IRRIG 1000ML POUR BTL (IV SOLUTION) ×3 IMPLANT
PACK EXTREMITY ARMC (MISCELLANEOUS) ×3 IMPLANT
PAD ABD DERMACEA PRESS 5X9 (GAUZE/BANDAGES/DRESSINGS) ×6 IMPLANT
PAD CAST CTTN 4X4 STRL (SOFTGOODS) ×2 IMPLANT
PAD PREP 24X41 OB/GYN DISP (PERSONAL CARE ITEMS) ×3 IMPLANT
PADDING CAST COTTON 4X4 STRL (SOFTGOODS) ×4
PLATE LOCK 6H RT MED DIST TIB (Plate) ×3 IMPLANT
PLATE LOCK 7H 92 BILAT FIB (Plate) ×3 IMPLANT
SCREW CORTICAL 3.5MM  28MM (Screw) ×2 IMPLANT
SCREW CORTICAL 3.5MM  30MM (Screw) ×2 IMPLANT
SCREW CORTICAL 3.5MM 28MM (Screw) ×1 IMPLANT
SCREW CORTICAL 3.5MM 30MM (Screw) ×1 IMPLANT
SCREW LOCK CORT STAR 3.5X16 (Screw) ×3 IMPLANT
SCREW LOCK CORT STAR 3.5X18 (Screw) ×6 IMPLANT
SCREW LOCK CORT STAR 3.5X42 (Screw) ×6 IMPLANT
SCREW LOCK CORT STAR 3.5X48 (Screw) ×3 IMPLANT
SCREW LOW PROFILE 18MMX3.5MM (Screw) ×3 IMPLANT
SCREW NON LOCKING LP 3.5 16MM (Screw) ×9 IMPLANT
SPONGE LAP 18X18 5 PK (GAUZE/BANDAGES/DRESSINGS) ×3 IMPLANT
STAPLER SKIN PROX 35W (STAPLE) ×3 IMPLANT
STOCKINETTE IMPERV 14X48 (MISCELLANEOUS) ×3 IMPLANT
SUT VIC AB 0 CT1 36 (SUTURE) ×3 IMPLANT
SUT VIC AB 2-0 SH 27 (SUTURE) ×4
SUT VIC AB 2-0 SH 27XBRD (SUTURE) ×2 IMPLANT
SYRINGE 10CC LL (SYRINGE) ×3 IMPLANT
TRAY FOLEY CATH SILVER 16FR LF (SET/KITS/TRAYS/PACK) ×3 IMPLANT

## 2016-09-23 SURGICAL SUPPLY — 34 items
BANDAGE ACE 3X5.8 VEL STRL LF (GAUZE/BANDAGES/DRESSINGS) ×3 IMPLANT
BANDAGE ACE 4X5 VEL STRL LF (GAUZE/BANDAGES/DRESSINGS) ×3 IMPLANT
BANDAGE STRETCH 3X4.1 STRL (GAUZE/BANDAGES/DRESSINGS) ×3 IMPLANT
BNDG ESMARK 4X12 TAN STRL LF (GAUZE/BANDAGES/DRESSINGS) ×3 IMPLANT
CANISTER SUCT 1200ML W/VALVE (MISCELLANEOUS) ×3 IMPLANT
CAST PADDING 3X4FT ST 30246 (SOFTGOODS) ×6
CHLORAPREP W/TINT 26ML (MISCELLANEOUS) ×3 IMPLANT
CLOSURE WOUND 1/4X4 (GAUZE/BANDAGES/DRESSINGS) ×1
CORD BIP STRL DISP 12FT (MISCELLANEOUS) ×3 IMPLANT
CUFF TOURN 18 STER (MISCELLANEOUS) IMPLANT
DRAPE FLUOR MINI C-ARM 54X84 (DRAPES) ×3 IMPLANT
FORCEPS JEWEL BIP 4-3/4 STR (INSTRUMENTS) ×3 IMPLANT
GAUZE PETRO XEROFOAM 1X8 (MISCELLANEOUS) ×3 IMPLANT
GAUZE SPONGE 4X4 12PLY STRL (GAUZE/BANDAGES/DRESSINGS) ×3 IMPLANT
GLOVE BIO SURGEON STRL SZ8 (GLOVE) ×6 IMPLANT
GLOVE INDICATOR 8.0 STRL GRN (GLOVE) ×3 IMPLANT
GOWN STRL REUS W/ TWL LRG LVL3 (GOWN DISPOSABLE) ×1 IMPLANT
GOWN STRL REUS W/ TWL XL LVL3 (GOWN DISPOSABLE) ×1 IMPLANT
GOWN STRL REUS W/TWL LRG LVL3 (GOWN DISPOSABLE) ×2
GOWN STRL REUS W/TWL XL LVL3 (GOWN DISPOSABLE) ×2
KIT RM TURNOVER STRD PROC AR (KITS) ×3 IMPLANT
NEEDLE HYPO 25X1 1.5 SAFETY (NEEDLE) ×3 IMPLANT
NS IRRIG 500ML POUR BTL (IV SOLUTION) ×3 IMPLANT
PACK EXTREMITY ARMC (MISCELLANEOUS) ×3 IMPLANT
PAD CAST CTTN 3X4 STRL (SOFTGOODS) ×3 IMPLANT
PASSER SUT SWANSON 36MM LOOP (INSTRUMENTS) ×3 IMPLANT
STOCKINETTE STRL 4IN 9604848 (GAUZE/BANDAGES/DRESSINGS) ×3 IMPLANT
STRIP CLOSURE SKIN 1/4X4 (GAUZE/BANDAGES/DRESSINGS) ×2 IMPLANT
SUT PROLENE 4 0 PS 2 18 (SUTURE) ×3 IMPLANT
SUT VIC AB 4-0 SH 27 (SUTURE) ×2
SUT VIC AB 4-0 SH 27XANBCTRL (SUTURE) ×1 IMPLANT
SWABSTK COMLB BENZOIN TINCTURE (MISCELLANEOUS) ×3 IMPLANT
SYRINGE 10CC LL (SYRINGE) ×3 IMPLANT
WIRE Z .062 C-WIRE SPADE TIP (WIRE) ×6 IMPLANT

## 2016-09-23 NOTE — Transfer of Care (Signed)
Immediate Anesthesia Transfer of Care Note  Patient: Kelli Marks  Procedure(s) Performed: Procedure(s): OPEN REDUCTION INTERNAL FIXATION (ORIF) ANKLE FRACTURE (Right)  Patient Location: PACU  Anesthesia Type:General  Level of Consciousness: sedated  Airway & Oxygen Therapy: Patient Spontanous Breathing and Patient connected to face mask oxygen  Post-op Assessment: Report given to RN and Post -op Vital signs reviewed and stable  Post vital signs: Reviewed and stable  Last Vitals:  Vitals:   09/23/16 1419 09/23/16 1732  BP: 131/64 118/62  Pulse: 95 90  Resp: 20 15  Temp: 36.6 C 36.5 C    Last Pain:  Vitals:   09/23/16 1419  TempSrc: Tympanic  PainSc: 7          Complications: No apparent anesthesia complications

## 2016-09-23 NOTE — NC FL2 (Signed)
[place] LEVEL OF CARE SCREENING TOOL     IDENTIFICATION  Patient Name: Kelli Marks Birthdate: [birth year] Sex: female Admission Date (Current Location): 09/22/2016  [place] and [place] Number:  Engineering geologist and Address:  [facility], [address], [place], [place] [address]      Provider Number: [id number]  Attending Physician Name and Address:  Corky Mull, MD  Relative Name and Phone Number:       Current Level of Care: Hospital Recommended Level of Care: [place] Prior Approval Number:    Date Approved/Denied:   PASRR Number:  [id number])  Discharge Plan: SNF    Current Diagnoses: Patient Active Problem List   Diagnosis Date Noted  . Closed right ankle fracture 09/22/2016  . Cigarette smoker 11/27/2014  . Clinical depression 11/27/2014  . Acid reflux 11/27/2014  . Cannot sleep 11/27/2014  . 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 Acute respiratory failure ([place]) 10/31/2014  . Splenic laceration 10/31/2014  . Anemia 10/31/2014  . Hyponatremia 10/31/2014  . Essential hypertriglyceridemia 02/26/2014  . Anxiety, generalized 04/10/2013    Orientation RESPIRATION BLADDER Height & Weight     Self, Time, Situation, Place  Normal Continent Weight: 136 lb (61.7 kg) Height:  5\' 7"  (170.2 cm)  BEHAVIORAL SYMPTOMS/MOOD NEUROLOGICAL BOWEL NUTRITION STATUS   (none)  (none) Continent Diet (NPO for surgery )  AMBULATORY STATUS COMMUNICATION OF NEEDS Skin   Extensive Assist Verbally Surgical wounds                       Personal Care Assistance Level of Assistance  Bathing, Feeding, Dressing Bathing Assistance: Limited assistance Feeding assistance: Independent Dressing Assistance: Limited assistance     Functional Limitations Info  Sight, Hearing, Speech Sight Info: Adequate Hearing Info: Adequate Speech Info: Adequate    SPECIAL CARE FACTORS FREQUENCY  PT (By licensed PT), OT (By  licensed OT)     PT Frequency:  (5) OT Frequency:  (5)            Contractures      Additional Factors Info  Code Status, Allergies Code Status Info:  (Full Code. ) Allergies Info:  (No Known Allergies. )           Current Medications (09/23/2016):  This is the current hospital active medication list Current Facility-Administered Medications  Medication Dose Route Frequency Provider Last Rate Last Dose  . acetaminophen (TYLENOL) tablet 650 mg  650 mg Oral Q6H PRN Corky Mull, MD       Or  . acetaminophen (TYLENOL) suppository 650 mg  650 mg Rectal Q6H PRN Corky Mull, MD      . albuterol (PROVENTIL) (2.5 MG/3ML) 0.083% nebulizer solution 3 mL  3 mL Inhalation Q6H PRN Corky Mull, MD      . aspirin EC tablet 81 mg  81 mg Oral Daily Corky Mull, MD      . bisacodyl (DULCOLAX) suppository 10 mg  10 mg Rectal Daily PRN Corky Mull, MD      . ceFAZolin (ANCEF) 2 g in dextrose 5 % 100 mL IVPB  2 g Intravenous 30 min Pre-Op Corky Mull, MD      . clonazepam [facility]) disintegrating tablet 0.5 mg  0.5 mg Oral QHS Corky Mull, MD   0.5 mg at 09/23/16 0003  . dextrose 5 % and 0.9 % NaCl with KCl 20 mEq/L infusion   Intravenous Continuous Jenny Reichmann  Robby Sermon, MD 75 mL/hr at 09/23/16 0020    . docusate sodium (COLACE) capsule 100 mg  100 mg Oral BID Corky Mull, MD   100 mg at 09/23/16 0004  . HYDROmorphone (DILAUDID) injection 0.5-1 mg  0.5-1 mg Intravenous Q2H PRN Corky Mull, MD   1 mg at 09/23/16 0122  . lip balm (BLISTEX) ointment   Topical PRN Corky Mull, MD      . magnesium hydroxide (MILK OF MAGNESIA) suspension 30 mL  30 mL Oral Daily PRN Corky Mull, MD      . mometasone-formoterol [facility]) 100-5 MCG/ACT inhaler 2 puff  2 puff Inhalation BID Corky Mull, MD   2 puff at 09/23/16 0019  . ondansetron (ZOFRAN-ODT) disintegrating tablet 4 mg  4 mg Oral Q8H PRN Corky Mull, MD   4 mg at 09/23/16 0540  . oxybutynin (DITROPAN) tablet 5 mg  5 mg Oral Q8H PRN Corky Mull, MD       . oxyCODONE (Oxy IR/ROXICODONE) immediate release tablet 5-10 mg  5-10 mg Oral Q4H PRN Corky Mull, MD      . pantoprazole (PROTONIX) EC tablet 40 mg  40 mg Oral Daily Corky Mull, MD      . pantoprazole (PROTONIX) injection 40 mg  40 mg Intravenous QHS Corky Mull, MD   40 mg at 09/23/16 0003  . simvastatin (ZOCOR) tablet 10 mg  10 mg Oral q1800 Corky Mull, MD      . sodium phosphate (FLEET) 7-19 GM/118ML enema 1 enema  1 enema Rectal Once PRN Corky Mull, MD      . tiotropium [facility]) inhalation capsule 18 mcg  18 mcg Inhalation Daily Corky Mull, MD      . venlafaxine XR (EFFEXOR-XR) 24 hr capsule 75 mg  75 mg Oral Q breakfast Corky Mull, MD         Discharge Medications: Please see discharge summary for a list of discharge medications.  Relevant Imaging Results:  Relevant Lab Results:   Additional Information  (SSN: [id number])  Kelli Marks, Kelli Beets, LCSW

## 2016-09-23 NOTE — Anesthesia Post-op Follow-up Note (Cosign Needed)
Anesthesia QCDR form completed.        

## 2016-09-23 NOTE — Op Note (Signed)
09/22/2016 - 09/23/2016  5:30 PM  Patient:   Kelli Marks  Pre-Op Diagnosis:   Closed impacted distal tib-fib fracture, right ankle.  Post-Op Diagnosis:   Same.  Procedure:   Open reduction and internal fixation of distal tib-fib fractures, right ankle.  Surgeon:   Pascal Lux, MD  Assistant:   None  Anesthesia:   Spinal  Findings:   As above.  Complications:   None  EBL:   20 cc  Fluids:   1100 cc crystalloid  UOP:   250 cc  TT:   94 min at 250 mmHg  Drains:   None  Closure:   Staples, 2-0 Prolene interrupted sutures  Implants:   Biomet ALPS 6-hole medial tibial locking plate and screws, 7-hole composite plate for fibula  Brief Clinical Note:   The patient is a 56 year old female who sustained the above-noted injury last evening when she apparently missed a step and jammed her right ankle. She was brought to the emergency room where x-rays demonstrated the above-noted injury. She was admitted and presents at this time for definitive management of her injury.  Procedure:   The patient was brought into the operating room. After adequate spinal anesthesia was obtained, the patient was lain in the supine position. The right foot and lower leg were prepped with ChloraPrep solution, then draped sterilely. Preoperative antibiotics were administered. A timeout was performed to verify the appropriate surgical site before the limb was exsanguinated with an Esmarch and the calf tourniquet inflated to 250 mmHg. Laterally, a 5-6 cm incision was made over the lateral aspect of the distal fibula. The incision was carried down through the subcutaneous tissues to expose the fracture site. The fracture hematoma was debrided before the fracture was reduced. With the fracture held in reduction, a 7 hole timeout composite locking plate was applied over the lateral aspect of the distal fibula. After verifying its position fluoroscopically, it was temporarily secured using two K-wires. A 3.5 mm  nonlocking cortical screw was placed proximal to the fracture secure the plate. Again the plate's position was assessed fluoroscopically in AP and lateral projections before it was secured using two additional bicortical screws proximally and three locking screws distally. The adequacy of fracture reduction and hardware position was verified fluoroscopically in AP and lateral projections and found to be excellent. One cortical screw proximally was removed and replaced with a shorter screw as it appeared to be too long.  Attention was directed to the medial side. An approximately 3 cm longitudinal incision was made over the medial malleolus. This incision also was carried down through the subcutaneous tissues with care taken to identify and protect the saphenous nerve and vein. Subperiosteal dissection was carried up along the medial aspect of the tibia before a home at 6-hole medial tibial locking plate was positioned. After verifying its position fluoroscopically, the plate was secured proximally using a bicortical screw placed percutaneously. Again, the position of the plate was verified fluoroscopically in AP and lateral projections before it was secured distally using 3 locking screws were placed just proximal to the profound. A second cortical screw was placed more proximally through a second stab incision. Again the adequacy of fracture reduction, hardware position, and mortise restoration was verified in AP, lateral, and oblique projections and found to be excellent.  Each wound was copiously irrigated with sterile saline solution. Laterally, the subcutaneous tissues were closed in two layers using 2-0 Vicryl interrupted sutures before the skin was closed using staples. Medially, the subcutaneous  tissues were closed using 2-0 Vicryl interrupted sutures before the skin was closed using 2-0 Prolene interrupted sutures. Each of the stab incisions was closed using staples. A total of 30 cc of 0.5% plain  Sensorcaine was injected in and around the incision sites to help with postoperative analgesia. Sterile bulky dressings were applied to the wounds before the patient was placed into a posterior splint with a sugar tong supplement, maintaining the ankle in neutral dorsiflexion. The patient was then awakened and returned to the recovery room in satisfactory condition after tolerating the procedure well.

## 2016-09-23 NOTE — Clinical Social Work Note (Signed)
Clinical Social Work Assessment  Patient Details  Name: Kelli Marks MRN: [id number] Date of Birth: 1960/06/20  Date of referral:  09/23/16               Reason for consult:  Facility Placement                Permission sought to share information with:    Permission granted to share information::     Name::        Agency::     Relationship::     Contact Information:     Housing/Transportation Living arrangements for the past 2 months:  Single Family Home Source of Information:  Patient, Friend/Neighbor Patient Interpreter Needed:  None Criminal Activity/Legal Involvement Pertinent to Current Situation/Hospitalization:  No - Comment as needed Significant Relationships:  Adult Children, Friend Lives with:  Self Do you feel safe going back to the place where you live?  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    Social Worker assessment / plan:  Holiday representative (CSW) reviewed chart and noted that patient will have surgery for an ankle fracture today. CSW met with patient prior to surgery and her friend Warren Lacy was at bedside. Patient was alert and oriented X4 and was laying in the bed. CSW introduced self and explained role of CSW department. 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 CSW explained that PT will evaluate patient after surgery and recommend SNF or home health. Patient reported that she is going home with home health and would not consider SNF. RN case manager aware of above. CSW will continue to follow and assist as needed.   Employment status:  [employer] information:  Managed Care PT Recommendations:  Not assessed at this time Information / Referral to community resources:  Other (Comment Required) (Patient prefers home health )  Patient/Family's Response to care:   Patient prefers to go home with home health.   Patient/Family's Understanding of and Emotional Response to Diagnosis, Current Treatment, and Prognosis:  Patient was pleasant and thanked CSW for visit.   Emotional Assessment Appearance:  Appears stated age Attitude/Demeanor/Rapport:    Affect (typically observed):  Accepting, Adaptable, Pleasant Orientation:  Oriented to Self, Oriented to Place, Oriented to  Time, Oriented to Situation Alcohol / Substance use:  Not Applicable Psych involvement (Current and /or in the community):  No (Comment)  Discharge Needs  Concerns to be addressed:  Discharge Planning Concerns Readmission within the last 30 days:  No Current discharge risk:  Dependent with Mobility Barriers to Discharge:  Continued Medical Work up   [employer], Veronia Beets, LCSW 09/23/2016, 12:39 PM

## 2016-09-23 NOTE — Anesthesia Procedure Notes (Signed)
Spinal  Patient location during procedure: OR Staffing Anesthesiologist: Gunnar Bulla Performed: anesthesiologist  Preanesthetic Checklist Completed: patient identified, site marked, surgical consent, pre-op evaluation, timeout performed, IV checked and risks and benefits discussed Spinal Block Patient position: sitting Prep: Betadine Patient monitoring: heart rate, cardiac monitor, continuous pulse ox and blood pressure Approach: midline Location: L3-4 Injection technique: single-shot Needle Needle type: Pencil-Tip  Needle gauge: 25 G Needle length: 9 cm Assessment Sensory level: T10 Additional Notes 0.5% marcaine 2.63ml.

## 2016-09-23 NOTE — H&P (Signed)
Subjective:  Chief complaint:  Right ankle pain.  The patient is a 56 y.o. 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 Apparently she missed a step while descending a flight of stairs and jammed her ankle. She was unable to bear weight on the leg and presented to the emergency room where x-rays demonstrated an impacted right distal tib-fib fracture. She has been admitted at this time for definitive management of her injury. The patient denies any associated injury or loss of consciousness associated with the injury, and denies any light-headedness, loss of consciousness, chest pain, or shortness of breath which might have contributed to the injury.  Patient Active Problem List   Diagnosis Date Noted  . Closed right ankle fracture 09/22/2016  . Cigarette smoker 11/27/2014  . Clinical depression 11/27/2014  . Acid reflux 11/27/2014  . Cannot sleep 11/27/2014  . 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 Acute respiratory failure ([place]) 10/31/2014  . Splenic laceration 10/31/2014  . Anemia 10/31/2014  . Hyponatremia 10/31/2014  . Essential hypertriglyceridemia 02/26/2014  . Anxiety, generalized 04/10/2013   Past Medical History:  Diagnosis Date  . Hypercholesteremia   . Hyperlipidemia   . Rib fractures     Past Surgical History:  Procedure Laterality Date  . ABDOMINAL HYSTERECTOMY    . ANKLE SURGERY Left   . CHOLECYSTECTOMY    . 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 TONSILLECTOMY      Prescriptions Prior to Admission  Medication Sig Dispense Refill Last Dose  . albuterol (PROVENTIL HFA;VENTOLIN HFA) 108 (90 BASE) MCG/ACT inhaler Inhale 2 puffs into the lungs every 6 (six) hours as needed for wheezing or shortness  of breath. (Patient not taking: Reported on 11/27/2014) 1 Inhaler 3 Not Taking  . aspirin EC 81 MG tablet Take 81 mg by mouth daily.   Taking  . clonazePAM (KLONOPIN) 0.5 MG tablet TAKE 1/2 TO 1 TABLET BY MOUTH EVERY DAY ONLY AS NEEDED FOR ANXIETY  5 Taking  . ferrous sulfate 325 (65 FE) MG tablet Take 1 tablet (325 mg total) by mouth daily with breakfast. 30 tablet 3 Taking  . mometasone-formoterol (DULERA) 100-5 MCG/ACT AERO Inhale 2 puffs into the lungs 2 (two) times daily. (Patient not taking: Reported on 11/27/2014) 1 Inhaler 2 Not Taking  . ondansetron (ZOFRAN) 4 MG tablet Take 1 tablet (4 mg total) by mouth daily as needed for nausea or vomiting. 20 tablet 1 Taking  . oxybutynin (DITROPAN) 5 MG tablet Take 5 mg by mouth 2 (two) times daily.   Taking  . oxyCODONE-acetaminophen (PERCOCET/ROXICET) 5-325 MG per tablet Take 1-2 tablets by mouth every 6 (six) hours as needed for severe pain. 40 tablet 0 Taking  . ranitidine (ZANTAC) 150 MG tablet Take 150 mg by mouth daily.     . simvastatin (ZOCOR) 10 MG tablet Take 10 mg by mouth at bedtime.    Taking  . tiotropium (SPIRIVA) 18 MCG inhalation capsule Place 1 capsule (18 mcg total) into inhaler and inhale daily. (Patient not taking: Reported on 11/27/2014) 30 capsule 2 Not Taking  . venlafaxine XR (EFFEXOR-XR) 75 MG 24 hr capsule Take by mouth.   Taking   No Known Allergies  Social History  Substance Use Topics  .  Smoking status: Current Every Day Smoker    Packs/day: 1.00    Years: 30.00    Types: Cigarettes  . Smokeless tobacco: Never Used  . Alcohol use No    Family History  Problem Relation Age of Onset  . Rectal cancer Mother   . Lung cancer Father      Review of Systems: As noted above. The patient denies any chest pain, shortness of breath, nausea, vomiting, diarrhea, constipation, belly pain, blood in his/her stool, or burning with urination.  Objective: Temp:  [97.5 F (36.4 C)-98.3 F (36.8 C)] 97.5 F (36.4 C) (04/19  2328) Pulse Rate:  [82-89] 82 (04/19 2328) Resp:  [18-20] 18 (04/19 2328) BP: (147-151)/(67-75) 151/67 (04/19 2328) SpO2:  [96 %-99 %] 96 % (04/19 2328) Weight:  [61.7 kg (136 lb)] 61.7 kg (136 lb) (04/19 1825)  Physical Exam: General:  Alert, no acute distress Psychiatric:  Patient is competent for consent with normal mood and affect Cardiovascular:  RRR  Respiratory:  Clear to auscultation. No wheezing. Non-labored breathing GI:  Abdomen is soft and non-tender Skin:  No lesions in the area of chief complaint Neurologic:  Sensation intact distally Lymphatic:  No axillary or cervical lymphadenopathy  Orthopedic Exam:  Examination is limited to the right lower extremity and foot. A posterior splint is in place and appears to be dry and intact. She is able to actively dorsiflex and plantarflex her toes. She has good capillary refill to the toes of her right foot.  Imaging Review: Recent x-rays of the right ankle are available for review.  These films demonstrate an impacted transverse distal fibular fracture as well as a the valgus impacted fracture of the tibial plafond. The mortise itself appears to be reasonably well maintained. No significant degenerative changes are noted.  Assessment: Impacted right distal tib-fib fracture.  Plan: The treatment options have been discussed in detail with the patient. The proposed treatment plan is an attempted closed reduction with possible open reduction and internal fixation of the right ankle fracture The risks (including bleeding, infection, nerve and/or blood vessel injury, persistent or recurrent pain, malunion and/or nonunion, development of arthritis, need for further surgery, blood clots, strokes, heart attacks or arrhythmias, pneumonia, etc.) and benefits of this procedure were discussed. The patient states her understanding and agrees to proceed. A formal written consent will be obtained by the nursing staff.

## 2016-09-23 NOTE — Anesthesia Preprocedure Evaluation (Signed)
Anesthesia Evaluation  Patient identified by MRN, date of birth, ID band Patient awake    Reviewed: Allergy & Precautions, NPO status , Patient's Chart, lab work & pertinent test results, reviewed documented beta blocker date and time   Airway Mallampati: II  TM Distance: >3 FB     Dental  (+) Chipped   Pulmonary Current Smoker,           Cardiovascular      Neuro/Psych PSYCHIATRIC DISORDERS Depression  Neuromuscular disease    GI/Hepatic GERD  Controlled,  Endo/Other    Renal/GU      Musculoskeletal   Abdominal   Peds  Hematology  (+) anemia ,   Anesthesia Other Findings   Reproductive/Obstetrics                             Anesthesia Physical Anesthesia Plan  ASA: II  Anesthesia Plan: Spinal   Post-op Pain Management:    Induction:   Airway Management Planned:   Additional Equipment:   Intra-op Plan:   Post-operative Plan:   Informed Consent: I have reviewed the patients History and Physical, chart, labs and discussed the procedure including the risks, benefits and alternatives for the proposed anesthesia with the patient or authorized representative who has indicated his/her understanding and acceptance.     Plan Discussed with: CRNA  Anesthesia Plan Comments:         Anesthesia Quick Evaluation

## 2016-09-24 DIAGNOSIS — S82871A Displaced pilon fracture of right tibia, initial encounter for closed fracture: Secondary | ICD-10-CM | POA: Insufficient documentation

## 2016-09-24 DIAGNOSIS — S82831A Other fracture of upper and lower end of right fibula, initial encounter for closed fracture: Secondary | ICD-10-CM | POA: Insufficient documentation

## 2016-09-24 LAB — CBC WITH DIFFERENTIAL/PLATELET
BASOS ABS: 0.2 10*3/uL — AB (ref 0–0.1)
Basophils Relative: 1 %
EOS PCT: 1 %
Eosinophils Absolute: 0.2 10*3/uL (ref 0–0.7)
HEMATOCRIT: 34.4 % — AB (ref 35.0–47.0)
HEMOGLOBIN: 11.6 g/dL — AB (ref 12.0–16.0)
LYMPHS ABS: 2.7 10*3/uL (ref 1.0–3.6)
LYMPHS PCT: 17 %
MCH: 31.7 pg (ref 26.0–34.0)
MCHC: 33.7 g/dL (ref 32.0–36.0)
MCV: 94.2 fL (ref 80.0–100.0)
Monocytes Absolute: 1.9 10*3/uL — ABNORMAL HIGH (ref 0.2–0.9)
Monocytes Relative: 12 %
NEUTROS ABS: 10.8 10*3/uL — AB (ref 1.4–6.5)
NEUTROS PCT: 69 %
Platelets: 355 10*3/uL (ref 150–440)
RBC: 3.66 MIL/uL — AB (ref 3.80–5.20)
RDW: 13.6 % (ref 11.5–14.5)
WBC: 15.8 10*3/uL — ABNORMAL HIGH (ref 3.6–11.0)

## 2016-09-24 LAB — BASIC METABOLIC PANEL
ANION GAP: 4 — AB (ref 5–15)
BUN: 8 mg/dL (ref 6–20)
CALCIUM: 8.3 mg/dL — AB (ref 8.9–10.3)
CHLORIDE: 106 mmol/L (ref 101–111)
CO2: 30 mmol/L (ref 22–32)
Creatinine, Ser: 0.74 mg/dL (ref 0.44–1.00)
GFR calc Af Amer: >60 mL/min (ref >60)
GLUCOSE: 110 mg/dL — AB (ref 65–99)
POTASSIUM: 4 mmol/L (ref 3.5–5.1)
Sodium: 140 mmol/L (ref 135–145)

## 2016-09-24 MED ORDER — OXYCODONE HCL 5 MG PO TABS: 5.0000 mg | ORAL_TABLET | ORAL | 0 refills | Status: DC | PRN

## 2016-09-24 MED ORDER — ENOXAPARIN SODIUM 40 MG/0.4ML ~~LOC~~ SOLN: 40.0000 mg | SUBCUTANEOUS | 0 refills | Status: DC

## 2016-09-24 NOTE — Discharge Instructions (Signed)
INSTRUCTIONS AFTER Surgery  o Remove items at home which could result in a fall. This includes throw rugs or furniture in walking pathways o ICE to the affected joint every three hours while awake for 30 minutes at a time, for at least the first 3-5 days, and then as needed for pain and swelling.  Continue to use ice for pain and swelling. You may notice swelling that will progress down to the foot and ankle.  This is normal after surgery.  Elevate your leg when you are not up walking on it.   o Continue to use the breathing machine you got in the hospital (incentive spirometer) which will help keep your temperature down.  It is common for your temperature to cycle up and down following surgery, especially at night when you are not up moving around and exerting yourself.  The breathing machine keeps your lungs expanded and your temperature down.   DIET:  As you were doing prior to hospitalization, we recommend a well-balanced diet.  DRESSING / WOUND CARE / SHOWERING  Leave the splint on at all times. Do not get the splint or ankle wet. No showering.  ACTIVITY  o Increase activity slowly as tolerated, but follow the weight bearing instructions below.   o No driving for 6 weeks or until further direction given by your physician.  You cannot drive while taking narcotics.  o No lifting or carrying greater than 10 lbs. until further directed by your surgeon. o Avoid periods of inactivity such as sitting longer than an hour when not asleep. This helps prevent blood clots.  o You may return to work once you are authorized by your doctor.     WEIGHT BEARING  Nonweightbearing on the right leg with a walker.   EXERCISES No exercises at this time of the ankle.  CONSTIPATION  Constipation is defined medically as fewer than three stools per week and severe constipation as less than one stool per week.  Even if you have a regular bowel pattern at home, your normal regimen is likely to be disrupted  due to multiple reasons following surgery.  Combination of anesthesia, postoperative narcotics, change in appetite and fluid intake all can affect your bowels.   YOU MUST use at least one of the following options; they are listed in order of increasing strength to get the job done.  They are all available over the counter, and you may need to use some, POSSIBLY even all of these options:    Drink plenty of fluids (prune juice may be helpful) and high fiber foods Colace 100 mg by mouth twice a day  Senokot for constipation as directed and as needed Dulcolax (bisacodyl), take with full glass of water  Miralax (polyethylene glycol) once or twice a day as needed.  If you have tried all these things and are unable to have a bowel movement in the first 3-4 days after surgery call either your surgeon or your primary doctor.    If you experience loose stools or diarrhea, hold the medications until you stool forms back up.  If your symptoms do not get better within 1 week or if they get worse, check with your doctor.  If you experience "the worst abdominal pain ever" or develop nausea or vomiting, please contact the office immediately for further recommendations for treatment.   ITCHING:  If you experience itching with your medications, try taking only a single pain pill, or even half a pain pill at a time.  You can also use Benadryl over the counter for itching or also to help with sleep.   TED HOSE STOCKINGS:  Use stockings on both legs until for at least 2 weeks or as directed by physician office. They may be removed at night for sleeping.  MEDICATIONS:  See your medication summary on the After Visit Summary that nursing will review with you.  You may have some home medications which will be placed on hold until you complete the course of blood thinner medication.  It is important for you to complete the blood thinner medication as prescribed.  PRECAUTIONS:  If you experience chest pain or shortness  of breath - call 911 immediately for transfer to the hospital emergency department.   If you develop a fever greater that 101 F, purulent drainage from wound, increased redness or drainage from wound, foul odor from the wound/dressing, or calf pain - CONTACT YOUR SURGEON.                                                   FOLLOW-UP APPOINTMENTS:  If you do not already have a post-op appointment, please call the office for an appointment to be seen by your surgeon.  Guidelines for how soon to be seen are listed in your After Visit Summary, but are typically between 1-4 weeks after surgery.  OTHER INSTRUCTIONS:     MAKE SURE YOU:   Understand these instructions.   Get help right away if you are not doing well or get worse.    Thank you for letting us be a part of your medical care team.  It is a privilege we respect greatly.  We hope these instructions will help you stay on track for a fast and full recovery!

## 2016-09-24 NOTE — Care Management Note (Addendum)
Case Management Note  Patient Details  Name: Kelli Marks MRN: [id number] Date of Birth: 08-Mar-1961  Subjective/Objective:   Discussed discharge planning with Kelli Marks. A referral was called to Melene Muller at [facility] requesting HH-PT. Ms [name] refuses crutches and already has a RW at home.                  Action/Plan:   Expected Discharge Date:  09/24/16               Expected Discharge Plan:     In-House Referral:     Discharge planning Services     Post Acute Care Choice:    Choice offered to:     DME Arranged:    DME Agency:     HH Arranged:    HH Agency:     Status of Service:     If discussed at [employer], dates discussed:    Additional Comments:  Mercie Balsley A, RN 09/24/2016, 11:59 AM

## 2016-09-24 NOTE — Clinical Social Work Note (Signed)
CSW received consult for placement. The client is declining SNF and wants to return home. CSW signing off.  Santiago Bumpers, MSW, Latanya Presser [phone]

## 2016-09-24 NOTE — Evaluation (Addendum)
Physical Therapy Evaluation Patient Details Name: Kelli Marks MRN: [id number] DOB: April 25, 1961 Today's Date: 09/24/2016   History of Present Illness  The patient is a 56 y.o. 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 Apparently she missed a step while descending a flight of stairs and jammed her ankle.   S/P ORIF R ankle on 09/23/2016  Clinical Impression  Pt presents to PT with decreased functional mobility and ambulation secondary to above.  Pt able to manage pain with prescribed pain meds and performs basic transfers and ambulation 20' with RW with Mod I.  Pt also demonstrated independence with knee walker as an option after discharge for her mobility needs.  Pt reports she has had a broken ankle before and understands the limitations of [place] status.  Pt has bedroom on second floor which she plans on scooting up the steps as she did last time she broke her ankle.  Multiple family members available to assist and daughter is planning on staying with pt.  Pt has no further acute PT needs and is clear for discharge home.    Follow Up Recommendations No PT follow up    Equipment Recommendations  Rolling walker with 5" wheels;3in1 (PT)    Recommendations for Other Services       Precautions / Restrictions Precautions Precautions: Fall Restrictions RLE Weight Bearing: Non weight bearing      Mobility  Bed Mobility Overal bed mobility: Modified Independent             General bed mobility comments: HOB elevated  Transfers Overall transfer level: Modified independent Equipment used: Rolling walker (2 wheeled)             General transfer comment: Maintains NWB'ing 100% of the time during transfers, good use of UE's on chair/bed to assist with lift off  Ambulation/Gait Ambulation/Gait assistance:  Modified independent (Device/Increase time) Ambulation Distance (Feet): 20 Feet Assistive device: Rolling walker (2 wheeled) Gait Pattern/deviations: Step-to pattern Gait velocity: Pt amb 20' with RW with Mod I and then amb 150' with knee walker with supervision.   General Gait Details: [place]  Stairs Stairs: Yes Stairs assistance: Min assist Stair Management: Two rails Number of Stairs: 1 General stair comments: Up down 1 step with 2 person assist to simulate rails  Wheelchair Mobility    Modified Rankin (Stroke Patients Only)       Balance Overall balance assessment: Modified Independent                                           Pertinent Vitals/Pain Pain Assessment: 0-10 Pain Score: 5  Pain Location: L ankle/foot Pain Descriptors / Indicators: Aching Pain Intervention(s): Limited activity within patient's tolerance    Home Living Family/patient expects to be discharged to:: Private residence Living Arrangements: Children;Alone (daughter to stay and help) Available Help at Discharge: Family Type of Home: House Home Access: Stairs to enter   [employer] of Steps: 2 Home Layout: Two level Home Equipment: Wheelchair - manual;Cane - single point;Bedside commode      Prior Function Level of Independence: Independent         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     Hand Dominance  Extremity/Trunk Assessment   Upper Extremity Assessment Upper Extremity Assessment: Overall WFL for tasks assessed    Lower Extremity Assessment Lower Extremity Assessment: Overall WFL for tasks assessed (L ankle with soft cast)    Cervical / Trunk Assessment Cervical / Trunk Assessment: Normal  Communication   Communication: No difficulties  Cognition Arousal/Alertness: Awake/alert Behavior During Therapy: WFL for tasks assessed/performed Overall Cognitive Status: Within Functional Limits for tasks assessed                                         General Comments General comments (skin integrity, edema, etc.): surgical dressing intact    Exercises     Assessment/Plan    PT Assessment Patent does not need any further PT services  PT Problem List         PT Treatment Interventions      PT Goals (Current goals can be found in the Care Plan section)  Acute Rehab PT Goals Patient Stated Goal: To go home. PT Goal Formulation: With patient Time For Goal Achievement: 09/24/16 Potential to Achieve Goals: Good    Frequency     Barriers to discharge        Co-evaluation               End of Session   Activity Tolerance: Patient tolerated treatment well Patient left: in bed (leg elevated) Nurse Communication: Mobility status PT Visit Diagnosis: Difficulty in walking, not elsewhere classified (R26.2)    Time: 9470-9628 PT Time Calculation (min) (ACUTE ONLY): 35 min   Charges:   PT Evaluation $PT Eval Moderate Complexity: 1 Procedure PT Treatments $Gait Training: 8-22 mins   PT G Codes:         Ivelis Norgard A Mariajose Mow, PT 09/24/2016, 11:45 AM

## 2016-09-24 NOTE — Discharge Summary (Signed)
Physician Discharge Summary  Subjective: 1 Day Post-Op Procedure(s) (LRB): OPEN REDUCTION INTERNAL FIXATION (ORIF) ANKLE FRACTURE (Right) Patient reports pain as mild.   Patient seen in rounds with Dr. Roland Rack. Patient is well, and has had no acute complaints or problems Patient is ready to go Home  Physician Discharge Summary  Patient ID: Kelli Marks MRN: [id number] DOB/AGE: 57-Jul-1962 57 y.o.  Admit date: 09/22/2016 Discharge date: 09/24/2016  Admission Diagnoses:  Discharge Diagnoses:  Active Problems:   Closed right ankle fracture   Discharged Condition: fair  Hospital Course: The patient is postop day 1 from a right ankle bimalleolar ORIF. She did well last night with pain control. She has been keeping it elevated and ice on the leg. She would like to ambulate with a walker with physical therapy before discharge.  Treatments: surgery:  Procedure:   Open reduction and internal fixation of distal tib-fib fractures, right ankle.  Surgeon:   Pascal Lux, MD  Assistant:   None  Anesthesia:   Spinal  Findings:   As above.  Complications:   None  EBL:   20 cc  Fluids:   1100 cc crystalloid  UOP:   250 cc  TT:   94 min at 250 mmHg  Drains:   None  Closure:   Staples, 2-0 Prolene interrupted sutures  Implants:   Biomet ALPS 6-hole medial tibial locking plate and screws, 7-hole composite plate for fibula  Discharge Exam: Blood pressure (!) 151/71, pulse 98, temperature 99 F (37.2 C), temperature source Oral, resp. rate 19, height 5\' 7"  (1.702 m), weight 61.7 kg (136 lb), SpO2 98 %.   Disposition: 01-Home or Self Care   Allergies as of 09/24/2016   No Known Allergies     Medication List    TAKE these medications   albuterol 108 (90 Base) MCG/ACT inhaler Commonly known as:  PROVENTIL HFA;VENTOLIN HFA Inhale 2 puffs into the lungs every 6 (six) hours as needed for wheezing or shortness of breath.   aspirin EC 81 MG tablet Take 81 mg  by mouth daily.   clonazePAM 0.5 MG tablet Commonly known as:  KLONOPIN TAKE 1/2 TO 1 TABLET BY MOUTH EVERY DAY ONLY AS NEEDED FOR ANXIETY   enoxaparin 40 MG/0.4ML injection Commonly known as:  LOVENOX Inject 0.4 mLs (40 mg total) into the skin daily.   ferrous sulfate 325 (65 FE) MG tablet Take 1 tablet (325 mg total) by mouth daily with breakfast.   mometasone-formoterol 100-5 MCG/ACT Aero Commonly known as:  DULERA Inhale 2 puffs into the lungs 2 (two) times daily.   ondansetron 4 MG tablet Commonly known as:  ZOFRAN Take 1 tablet (4 mg total) by mouth daily as needed for nausea or vomiting.   oxybutynin 5 MG tablet Commonly known as:  DITROPAN Take 5 mg by mouth 2 (two) times daily.   oxyCODONE 5 MG immediate release tablet Commonly known as:  Oxy IR/ROXICODONE Take 1-2 tablets (5-10 mg total) by mouth every 3 (three) hours as needed for breakthrough pain.   oxyCODONE-acetaminophen 5-325 MG tablet Commonly known as:  PERCOCET/ROXICET Take 1-2 tablets by mouth every 6 (six) hours as needed for severe pain.   ranitidine 150 MG tablet Commonly known as:  ZANTAC Take 150 mg by mouth daily.   simvastatin 10 MG tablet Commonly known as:  ZOCOR Take 10 mg by mouth at bedtime.   tiotropium 18 MCG inhalation capsule Commonly known as:  SPIRIVA Place 1 capsule (18 mcg total) into inhaler and  inhale daily.   venlafaxine XR 75 MG 24 hr capsule Commonly known as:  EFFEXOR-XR Take 225 mg by mouth daily with breakfast.        Signed: Franky Reier 09/24/2016, 6:15 AM   Objective: Vital signs in last 24 hours: Temp:  [97.7 F (36.5 C)-99.1 F (37.3 C)] 99 F (37.2 C) (04/20 2353) Pulse Rate:  [82-99] 98 (04/20 2353) Resp:  [14-25] 19 (04/20 2353) BP: (106-152)/(52-77) 151/71 (04/20 2353) SpO2:  [88 %-100 %] 98 % (04/20 2353) Weight:  [61.7 kg (136 lb)] 61.7 kg (136 lb) (04/20 1419)  Intake/Output from previous day:  Intake/Output Summary (Last 24 hours) at  09/24/16 0615 Last data filed at 09/23/16 1958  Gross per 24 hour  Intake           2057.5 ml  Output              720 ml  Net           1337.5 ml    Intake/Output this shift: Total I/O In: -  Out: 450 [Urine:450]  Labs:  Recent Labs  09/22/16 2139 09/24/16 0415  HGB 13.5 11.6*    Recent Labs  09/22/16 2139 09/24/16 0415  WBC 25.4* 15.8*  RBC 4.41 3.66*  HCT 40.7 34.4*  PLT 436 355    Recent Labs  09/22/16 2139 09/24/16 0415  NA 136 140  K 4.2 4.0  CL 101 106  CO2 28 30  BUN 8 8  CREATININE 0.69 0.74  GLUCOSE 107* 110*  CALCIUM 9.3 8.3*   No results for input(s): LABPT, INR in the last 72 hours.  EXAM: General - Patient is Alert and Oriented Extremity - Neurologically intact Dorsiflexion/Plantar flexion intact Incision - clean, dry with the postoperative splint intact Motor Function -  the patient is able to plantarflex and dorsiflex her toes.  Assessment/Plan: 1 Day Post-Op Procedure(s) (LRB): OPEN REDUCTION INTERNAL FIXATION (ORIF) ANKLE FRACTURE (Right) Procedure(s) (LRB): OPEN REDUCTION INTERNAL FIXATION (ORIF) ANKLE FRACTURE (Right) Past Medical History:  Diagnosis Date  . Hypercholesteremia   . Hyperlipidemia   . Rib fractures    Active Problems:   Closed right ankle fracture  Estimated body mass index is 21.3 kg/m as calculated from the following:   Height as of this encounter: 5\' 7"  (1.702 m).   Weight as of this encounter: 61.7 kg (136 lb). Advance diet Up with therapy D/C IV fluids  Plan to discharge home today. Physical therapy this morning. Diet - Regular diet Follow up - in 1 weeks Activity - NWB Disposition - Home Condition Upon Discharge - Fair DVT Prophylaxis - Lovenox  Reche Dixon, PA-C Orthopaedic Surgery 09/24/2016, 6:15 AM

## 2016-09-24 NOTE — Progress Notes (Signed)
Patient is being discharged to home with [facility].  DC and RX instructions given. Dr. Roland Rack brought Rx for nausea per patients request. IV removed.  Belongings packed and family here to take her home.

## 2016-09-24 NOTE — Anesthesia Postprocedure Evaluation (Signed)
Anesthesia Post Note  Patient: Kelli Marks  Procedure(s) Performed: Procedure(s) (LRB): OPEN REDUCTION INTERNAL FIXATION (ORIF) ANKLE FRACTURE (Right)  Patient location during evaluation: Nursing Unit Anesthesia Type: Spinal Comments: Pt discharged prior to being seen.     Last Vitals:  Vitals:   09/23/16 2353 09/24/16 0725  BP: (!) 151/71 (!) 124/57  Pulse: 98 (!) 103  Resp: 19 19  Temp: 37.2 C 36.7 C    Last Pain:  Vitals:   09/24/16 1018  TempSrc:   PainSc: 5                  Jakobi Thetford K

## 2016-09-24 NOTE — Progress Notes (Signed)
  Subjective: 1 Day Post-Op Procedure(s) (LRB): OPEN REDUCTION INTERNAL FIXATION (ORIF) ANKLE FRACTURE (Right) Patient reports pain as mild.   Patient seen in rounds with Dr. Roland Rack. Patient is well, and has had no acute complaints or problems Plan is to go Home after hospital stay. Negative for chest pain and shortness of breath Fever: no Gastrointestinal: Negative for nausea and vomiting  Objective: Vital signs in last 24 hours: Temp:  [97.7 F (36.5 C)-99.1 F (37.3 C)] 99 F (37.2 C) (04/20 2353) Pulse Rate:  [82-99] 98 (04/20 2353) Resp:  [14-25] 19 (04/20 2353) BP: (106-152)/(52-77) 151/71 (04/20 2353) SpO2:  [88 %-100 %] 98 % (04/20 2353) Weight:  [61.7 kg (136 lb)] 61.7 kg (136 lb) (04/20 1419)  Intake/Output from previous day:  Intake/Output Summary (Last 24 hours) at 09/24/16 0610 Last data filed at 09/23/16 1958  Gross per 24 hour  Intake           2057.5 ml  Output              720 ml  Net           1337.5 ml    Intake/Output this shift: Total I/O In: -  Out: 450 [Urine:450]  Labs:  Recent Labs  09/22/16 2139 09/24/16 0415  HGB 13.5 11.6*    Recent Labs  09/22/16 2139 09/24/16 0415  WBC 25.4* 15.8*  RBC 4.41 3.66*  HCT 40.7 34.4*  PLT 436 355    Recent Labs  09/22/16 2139 09/24/16 0415  NA 136 140  K 4.2 4.0  CL 101 106  CO2 28 30  BUN 8 8  CREATININE 0.69 0.74  GLUCOSE 107* 110*  CALCIUM 9.3 8.3*   No results for input(s): LABPT, INR in the last 72 hours.   EXAM General - Patient is Alert and Oriented Extremity - Sensation intact distally Dorsiflexion/Plantar flexion intact Dressing/Incision - clean, dry, no drainage in the postoperative cast Motor Function - intact, moving toes well on exam.   Past Medical History:  Diagnosis Date  . Hypercholesteremia   . Hyperlipidemia   . Rib fractures     Assessment/Plan: 1 Day Post-Op Procedure(s) (LRB): OPEN REDUCTION INTERNAL FIXATION (ORIF) ANKLE FRACTURE (Right) Active  Problems:   Closed right ankle fracture  Estimated body mass index is 21.3 kg/m as calculated from the following:   Height as of this encounter: 5\' 7"  (1.702 m).   Weight as of this encounter: 61.7 kg (136 lb). Advance diet Up with therapy Discontinue IV Discharge home today after physical therapy  DVT Prophylaxis - Lovenox Non-Weight-Bearing  to right leg  Reche Dixon, PA-C Orthopaedic Surgery 09/24/2016, 6:10 AM

## 2016-09-27 ENCOUNTER — Encounter: Payer: Self-pay | Admitting: Surgery

## 2016-10-24 DIAGNOSIS — L03115 Cellulitis of right lower limb: Secondary | ICD-10-CM | POA: Insufficient documentation

## 2017-01-12 ENCOUNTER — Encounter: Payer: Self-pay | Admitting: *Deleted

## 2017-01-18 ENCOUNTER — Ambulatory Visit: Payer: No Typology Code available for payment source | Admitting: Anesthesiology

## 2017-01-18 ENCOUNTER — Ambulatory Visit
Admission: RE | Admit: 2017-01-18 | Discharge: 2017-01-18 | Disposition: A | Discharge status: Home / Self Care | Payer: No Typology Code available for payment source | Source: Ambulatory Visit | Attending: Surgery | Admitting: Surgery

## 2017-01-18 ENCOUNTER — Encounter
Admission: RE | Disposition: A | Discharge status: Home / Self Care | Payer: Self-pay | Source: Ambulatory Visit | Attending: Surgery

## 2017-01-18 ENCOUNTER — Ambulatory Visit: Payer: No Typology Code available for payment source

## 2017-01-18 DIAGNOSIS — L03115 Cellulitis of right lower limb: Secondary | ICD-10-CM | POA: Diagnosis not present

## 2017-01-18 DIAGNOSIS — F17200 Nicotine dependence, unspecified, uncomplicated: Secondary | ICD-10-CM | POA: Diagnosis not present

## 2017-01-18 DIAGNOSIS — K219 Gastro-esophageal reflux disease without esophagitis: Secondary | ICD-10-CM | POA: Diagnosis not present

## 2017-01-18 DIAGNOSIS — J45909 Unspecified asthma, uncomplicated: Secondary | ICD-10-CM | POA: Diagnosis not present

## 2017-01-18 DIAGNOSIS — Z9889 Other specified postprocedural states: Secondary | ICD-10-CM

## 2017-01-18 HISTORY — DX: Other specified postprocedural states: Z98.890

## 2017-01-18 HISTORY — DX: Nausea with vomiting, unspecified: R11.2

## 2017-01-18 HISTORY — DX: Presence of dental prosthetic device (complete) (partial): Z97.2

## 2017-01-18 HISTORY — DX: Gastro-esophageal reflux disease without esophagitis: K21.9

## 2017-01-18 HISTORY — PX: HARDWARE REMOVAL: SHX979

## 2017-01-18 HISTORY — DX: Unspecified osteoarthritis, unspecified site: M19.90

## 2017-01-18 SURGERY — REMOVAL, HARDWARE
Anesthesia: General | Site: Ankle | Laterality: Right | Wound class: Clean

## 2017-01-18 MED ORDER — FENTANYL CITRATE (PF) 100 MCG/2ML IJ SOLN: 25.0000 ug | INTRAMUSCULAR | Status: DC | PRN

## 2017-01-18 MED ORDER — BUPIVACAINE HCL (PF) 0.5 % IJ SOLN
INTRAMUSCULAR | Status: DC | PRN
  Administered 2017-01-18: 10 mL

## 2017-01-18 MED ORDER — LIDOCAINE HCL (CARDIAC) 20 MG/ML IV SOLN
INTRAVENOUS | Status: DC | PRN
  Administered 2017-01-18: 50 mg via INTRATRACHEAL

## 2017-01-18 MED ORDER — LACTATED RINGERS IV SOLN
INTRAVENOUS | Status: DC
  Administered 2017-01-18: 08:00:00 via INTRAVENOUS

## 2017-01-18 MED ORDER — ACETAMINOPHEN 160 MG/5ML PO SOLN: 325.0000 mg | ORAL | Status: DC | PRN

## 2017-01-18 MED ORDER — SCOPOLAMINE 1 MG/3DAYS TD PT72
1.0000 | MEDICATED_PATCH | Freq: Once | TRANSDERMAL | Status: DC
  Administered 2017-01-18: 1.5 mg via TRANSDERMAL

## 2017-01-18 MED ORDER — DEXAMETHASONE SODIUM PHOSPHATE 4 MG/ML IJ SOLN
INTRAMUSCULAR | Status: DC | PRN
  Administered 2017-01-18: 8 mg via INTRAVENOUS

## 2017-01-18 MED ORDER — ONDANSETRON HCL 4 MG/2ML IJ SOLN
INTRAMUSCULAR | Status: DC | PRN
  Administered 2017-01-18: 4 mg via INTRAVENOUS

## 2017-01-18 MED ORDER — OXYCODONE HCL 5 MG PO TABS
5.0000 mg | ORAL_TABLET | Freq: Once | ORAL | Status: AC | PRN
  Administered 2017-01-18: 5 mg via ORAL

## 2017-01-18 MED ORDER — MIDAZOLAM HCL 5 MG/5ML IJ SOLN
INTRAMUSCULAR | Status: DC | PRN
  Administered 2017-01-18: 2 mg via INTRAVENOUS

## 2017-01-18 MED ORDER — OXYCODONE HCL 5 MG PO TABS: 5.0000 mg | ORAL_TABLET | ORAL | 0 refills | Status: DC | PRN

## 2017-01-18 MED ORDER — SODIUM CHLORIDE 0.9 % IV SOLN
900.0000 mg | Freq: Once | INTRAVENOUS | Status: AC
  Administered 2017-01-18: 900 mg via INTRAVENOUS

## 2017-01-18 MED ORDER — ONDANSETRON HCL 4 MG/2ML IJ SOLN: 4.0000 mg | Freq: Once | INTRAMUSCULAR | Status: DC | PRN

## 2017-01-18 MED ORDER — ACETAMINOPHEN 325 MG PO TABS: 325.0000 mg | ORAL_TABLET | ORAL | Status: DC | PRN

## 2017-01-18 MED ORDER — PROPOFOL 10 MG/ML IV BOLUS
INTRAVENOUS | Status: DC | PRN
  Administered 2017-01-18: 150 mg via INTRAVENOUS

## 2017-01-18 MED ORDER — OXYCODONE HCL 5 MG/5ML PO SOLN: 5.0000 mg | Freq: Once | ORAL | Status: AC | PRN

## 2017-01-18 MED ORDER — FENTANYL CITRATE (PF) 100 MCG/2ML IJ SOLN
INTRAMUSCULAR | Status: DC | PRN
  Administered 2017-01-18 (×2): 50 ug via INTRAVENOUS

## 2017-01-18 SURGICAL SUPPLY — 35 items
BANDAGE ELASTIC 3 LF NS (GAUZE/BANDAGES/DRESSINGS) IMPLANT
BANDAGE ELASTIC 4 CLIP ST LF (GAUZE/BANDAGES/DRESSINGS) IMPLANT
BNDG COHESIVE 4X5 TAN STRL (GAUZE/BANDAGES/DRESSINGS) ×3 IMPLANT
BNDG ESMARK 6X12 TAN STRL LF (GAUZE/BANDAGES/DRESSINGS) ×3 IMPLANT
CANISTER SUCT 1200ML W/VALVE (MISCELLANEOUS) ×3 IMPLANT
CHLORAPREP W/TINT 26ML (MISCELLANEOUS) ×3 IMPLANT
CORD BIP STRL DISP 12FT (MISCELLANEOUS) IMPLANT
COVER LIGHT HANDLE UNIVERSAL (MISCELLANEOUS) ×6 IMPLANT
CUFF TOURN SGL QUICK 18 (TOURNIQUET CUFF) ×3 IMPLANT
DRAPE C-ARM XRAY 36X54 (DRAPES) ×3 IMPLANT
DRAPE INCISE IOBAN 66X45 STRL (DRAPES) IMPLANT
DRAPE SURG 17X11 SM STRL (DRAPES) ×3 IMPLANT
GAUZE PETRO XEROFOAM 1X8 (MISCELLANEOUS) ×3 IMPLANT
GAUZE SPONGE 4X4 12PLY STRL (GAUZE/BANDAGES/DRESSINGS) ×3 IMPLANT
GLOVE BIO SURGEON STRL SZ8 (GLOVE) ×9 IMPLANT
GLOVE INDICATOR 8.0 STRL GRN (GLOVE) ×6 IMPLANT
GLOVE PI ULTRA LF STRL 7.5 (GLOVE) IMPLANT
GLOVE PI ULTRA NON LATEX 7.5 (GLOVE)
GOWN STRL REUS W/ TWL LRG LVL3 (GOWN DISPOSABLE) ×1 IMPLANT
GOWN STRL REUS W/ TWL XL LVL3 (GOWN DISPOSABLE) ×1 IMPLANT
GOWN STRL REUS W/TWL LRG LVL3 (GOWN DISPOSABLE) ×2
GOWN STRL REUS W/TWL XL LVL3 (GOWN DISPOSABLE) ×2
KIT ROOM TURNOVER OR (KITS) ×3 IMPLANT
NEEDLE 18GX1X1/2 (RX/OR ONLY) (NEEDLE) IMPLANT
NS IRRIG 500ML POUR BTL (IV SOLUTION) ×3 IMPLANT
PACK EXTREMITY ARMC (MISCELLANEOUS) ×3 IMPLANT
PAD GROUND ADULT SPLIT (MISCELLANEOUS) ×3 IMPLANT
STAPLER SKIN PROX 35W (STAPLE) ×3 IMPLANT
STOCKINETTE IMPERVIOUS 9X36 MD (GAUZE/BANDAGES/DRESSINGS) IMPLANT
STOCKINETTE IMPERVIOUS LG (DRAPES) ×3 IMPLANT
STRAP BODY AND KNEE 60X3 (MISCELLANEOUS) ×3 IMPLANT
SUT PROLENE 4 0 PS 2 18 (SUTURE) IMPLANT
SUT VIC AB 2-0 SH 27 (SUTURE) ×2
SUT VIC AB 2-0 SH 27XBRD (SUTURE) ×1 IMPLANT
SYRINGE 10CC LL (SYRINGE) IMPLANT

## 2017-01-18 NOTE — Op Note (Signed)
01/18/2017  9:40 AM  Patient:   Kelli Marks  Pre-Op Diagnosis:   Right lower extremity cellulitis secondary to presumed infected hardware status post ORIF closed displaced right distal tibia fracture.  Post-Op Diagnosis:   Same.  Procedure:   Hardware removal right distal tibia.  Surgeon:   Pascal Lux, MD  Assistant:   None  Anesthesia:   General LMA  Findings:   As above.  Complications:   None  Fluids:   900 cc crystalloid  EBL:   15 cc  UOP:   None  TT:   20 minutes at 250 mmHg  Drains:   None  Closure:   Staples  Brief Clinical Note:   The patient is a 56 year old female who is now nearly 4 months status post an open reduction and internal fixation of a right distal tibia and fibular fracture. Her postoperative course was notable for recurrent cellulitis with a small amount of drainage from one of the more proximal stab wounds medially. X-rays have demonstrated excellent healing of the fractures. She presents at this time for removal of the medial tibial plate.  Procedure:   The patient was brought into the operating room and lain in the supine position. After adequate general laryngeal mask anesthesia was obtained, the patient's right foot and lower leg were prepped with ChloraPrep solution before being draped sterilely. Preoperative antibiotics were administered. The limb was exsanguinated with an Esmarch and a calf tourniquet inflated to 220 50 mmHg. The previous incisions along the medial aspect of the lower leg were reopened and carried down through the subcutaneous tissues using blunt dissection to expose the plate. Distally, each of the three locking screws was removed sequentially using the appropriate screwdriver. Each of the two more proximal bicortical screws was removed through the appropriate stab incision with the appropriate screwdriver. The plate was then loosened and removed by sliding it distally and retrieving through the more distal wound. The  remaining rind was removed using a Soil scientist, curettes, and rongeurs. The C-arm was used to help identify the locations of the screws, as well as to verify that the tibia remained intact after hardware removal.  The wounds were copiously irrigated with sterile saline solution before the subcutaneous tissues were closed using 2-0 Vicryl interrupted sutures. The skin was closed using staples. A total of 10 cc of 0.5% plain Sensorcaine was injected in and around the incision sites to help with postoperative analgesia. A sterile bulky dressing was applied to the wounds before the patient was awakened, extubated, and returned to the recovery room in satisfactory condition after tolerating the procedure well.

## 2017-01-18 NOTE — Anesthesia Postprocedure Evaluation (Signed)
Anesthesia Post Note  Patient: Kelli Marks  Procedure(s) Performed: Procedure(s) (LRB): HARDWARE REMOVAL right tibia tibral plate and screws (Right)  Patient location during evaluation: PACU Anesthesia Type: General Level of consciousness: awake and alert and oriented Pain management: satisfactory to patient Vital Signs Assessment: post-procedure vital signs reviewed and stable Respiratory status: spontaneous breathing, nonlabored ventilation and respiratory function stable Cardiovascular status: blood pressure returned to baseline and stable Postop Assessment: Adequate PO intake and No signs of nausea or vomiting Anesthetic complications: no    Raliegh Ip

## 2017-01-18 NOTE — Discharge Instructions (Signed)
General Anesthesia, Adult, Care After These instructions provide you with information about caring for yourself after your procedure. Your health care provider may also give you more specific instructions. Your treatment has been planned according to current medical practices, but problems sometimes occur. Call your health care provider if you have any problems or questions after your procedure. What can I expect after the procedure? After the procedure, it is common to have:  Vomiting.  A sore throat.  Mental slowness.  It is common to feel:  Nauseous.  Cold or shivery.  Sleepy.  Tired.  Sore or achy, even in parts of your body where you did not have surgery.  Follow these instructions at home: For at least 24 hours after the procedure:  Do not: ? Participate in activities where you could fall or become injured. ? Drive. ? Use heavy machinery. ? Drink alcohol. ? Take sleeping pills or medicines that cause drowsiness. ? Make important decisions or sign legal documents. ? Take care of children on your own.  Rest. Eating and drinking  If you vomit, drink water, juice, or soup when you can drink without vomiting.  Drink enough fluid to keep your urine clear or pale yellow.  Make sure you have little or no nausea before eating solid foods.  Follow the diet recommended by your health care provider. General instructions  Have a responsible adult stay with you until you are awake and alert.  Return to your normal activities as told by your health care provider. Ask your health care provider what activities are safe for you.  Take over-the-counter and prescription medicines only as told by your health care provider.  If you smoke, do not smoke without supervision.  Keep all follow-up visits as told by your health care provider. This is important. Contact a health care provider if:  You continue to have nausea or vomiting at home, and medicines are not helpful.  You  cannot drink fluids or start eating again.  You cannot urinate after 8-12 hours.  You develop a skin rash.  You have fever.  You have increasing redness at the site of your procedure. Get help right away if:  You have difficulty breathing.  You have chest pain.  You have unexpected bleeding.  You feel that you are having a life-threatening or urgent problem. This information is not intended to replace advice given to you by your health care provider. Make sure you discuss any questions you have with your health care provider. Document Released: 08/29/2000 Document Revised: 10/26/2015 Document Reviewed: 05/07/2015 Elsevier Interactive Patient Education  2018 [employer].  Keep dressing dry and intact.  May shower after dressing changed on post-op day #4 (Sunday).  Cover staples/sutures with Band-Aids after drying off. Apply ice frequently to knee. Take ibuprofen 600 mg TID with meals for 7-10 days, then as necessary. Take pain medication as prescribed or ES Tylenol when needed.  May weight-bear as tolerated - use crutches or walker as needed. Follow-up in 10-14 days or as scheduled.

## 2017-01-18 NOTE — Anesthesia Preprocedure Evaluation (Signed)
Anesthesia Evaluation  Patient identified by MRN, date of birth, ID band Patient awake    Reviewed: Allergy & Precautions, H&P , NPO status , Patient's Chart, lab work & pertinent test results  Airway Mallampati: II  TM Distance: >3 FB Neck ROM: full    Dental  (+) Upper Dentures, Lower Dentures   Pulmonary asthma , Current Smoker,    Pulmonary exam normal breath sounds clear to auscultation       Cardiovascular Normal cardiovascular exam Rhythm:regular Rate:Normal     Neuro/Psych PSYCHIATRIC DISORDERS    GI/Hepatic GERD  ,  Endo/Other    Renal/GU      Musculoskeletal   Abdominal   Peds  Hematology   Anesthesia Other Findings   Reproductive/Obstetrics                             Anesthesia Physical Anesthesia Plan  ASA: II  Anesthesia Plan: General   Post-op Pain Management:    Induction:   PONV Risk Score and Plan: 3 and Ondansetron, Dexamethasone, Midazolam and Propofol infusion  Airway Management Planned:   Additional Equipment:   Intra-op Plan:   Post-operative Plan:   Informed Consent: I have reviewed the patients History and Physical, chart, labs and discussed the procedure including the risks, benefits and alternatives for the proposed anesthesia with the patient or authorized representative who has indicated his/her understanding and acceptance.     Plan Discussed with: CRNA  Anesthesia Plan Comments:         Anesthesia Quick Evaluation

## 2017-01-18 NOTE — Anesthesia Procedure Notes (Signed)
Procedure Name: LMA Insertion Date/Time: 01/18/2017 8:45 AM Performed by: Londell Moh Pre-anesthesia Checklist: Patient identified, Emergency Drugs available, Suction available, Timeout performed and Patient being monitored Patient Re-evaluated:Patient Re-evaluated prior to induction Oxygen Delivery Method: Circle system utilized Preoxygenation: Pre-oxygenation with 100% oxygen Induction Type: IV induction LMA: LMA inserted LMA Size: 4.0 Number of attempts: 1 Placement Confirmation: positive ETCO2 and breath sounds checked- equal and bilateral Tube secured with: Tape

## 2017-01-18 NOTE — H&P (Signed)
Paper H&P to be scanned into permanent record. H&P reviewed and patient re-examined. No changes. 

## 2017-01-18 NOTE — Transfer of Care (Signed)
Immediate Anesthesia Transfer of Care Note  Patient: Kelli Marks  Procedure(s) Performed: Procedure(s) with comments: HARDWARE REMOVAL right tibia tibral plate and screws (Right) - removed 5 screws and one plate from right ankle  Patient Location: PACU  Anesthesia Type: General  Level of Consciousness: awake, alert  and patient cooperative  Airway and Oxygen Therapy: Patient Spontanous Breathing and Patient connected to supplemental oxygen  Post-op Assessment: Post-op Vital signs reviewed, Patient's Cardiovascular Status Stable, Respiratory Function Stable, Patent Airway and No signs of Nausea or vomiting  Post-op Vital Signs: Reviewed and stable  Complications: No apparent anesthesia complications

## 2017-01-19 ENCOUNTER — Encounter: Payer: Self-pay | Admitting: Surgery

## 2019-01-26 ENCOUNTER — Ambulatory Visit
Admission: EM | Admit: 2019-01-26 | Discharge: 2019-01-26 | Disposition: A | Discharge status: Home / Self Care | Payer: No Typology Code available for payment source | Attending: Urgent Care | Admitting: Urgent Care

## 2019-01-26 ENCOUNTER — Other Ambulatory Visit: Payer: Self-pay

## 2019-01-26 ENCOUNTER — Ambulatory Visit (INDEPENDENT_AMBULATORY_CARE_PROVIDER_SITE_OTHER): Payer: No Typology Code available for payment source

## 2019-01-26 DIAGNOSIS — M25561 Pain in right knee: Secondary | ICD-10-CM

## 2019-01-26 DIAGNOSIS — W101XXA Fall (on)(from) sidewalk curb, initial encounter: Secondary | ICD-10-CM

## 2019-01-26 MED ORDER — HYDROCODONE-ACETAMINOPHEN 5-325 MG PO TABS: 1.0000 | ORAL_TABLET | Freq: Two times a day (BID) | ORAL | 0 refills | Status: DC | PRN

## 2019-01-26 MED ORDER — MELOXICAM 15 MG PO TABS: 15.0000 mg | ORAL_TABLET | Freq: Every day | ORAL | 0 refills | Status: DC

## 2019-01-26 NOTE — ED Provider Notes (Signed)
[place], [place]   Name: Kelli Marks DOB: 1960/08/17 MRN: [id number] CSN: [id number] PCP: Cecile Sheerer, MD  Arrival date and time:  01/26/19 [phone]  Chief Complaint:  Leg Pain   NOTE: Prior to seeing the patient today, I have reviewed the triage nursing documentation and vital signs. Clinical staff has updated patient's PMH/PSHx, current medication list, and drug allergies/intolerances to ensure comprehensive history available to assist in medical decision making.   History:   HPI: Kelli Marks is a 58 y.o. female who presents today with complaints of pain in her RIGHT knee. Patient reports that pain started yesterday when she stepped down off of a curb and felt something "pop". Pain extending from the lateral aspect of the knee distally to her mid-tib/fib area. She denies any paraesthesias; motor strength feels "about the same as it always does". She denies any previous injuries to her knee; no previous surgeries. Pain exacerbated by weight bearing and ambulation. Patient states, "it feels like a sharp shooting pain that feels like a charlie horse". In efforts to conservatively manage her symptoms at home, the patient notes that she has used ibuprofen, which did not help to improve her pain. Patient notes that she was up all night in pain. She found a retained Norco left over from a previous injury. She took this medication during the early morning hours, and notes that it really helped her.   Past Medical History:  Diagnosis Date   Arthritis    GERD (gastroesophageal reflux disease)    Hypercholesteremia    Hyperlipidemia    PONV (postoperative nausea and vomiting)    Rib fractures    Wears dentures    full upper and lower    Past Surgical History:  Procedure Laterality Date   ABDOMINAL HYSTERECTOMY     ANKLE SURGERY Left    CHOLECYSTECTOMY     HARDWARE REMOVAL Right 01/18/2017   Procedure: HARDWARE REMOVAL right tibia tibral plate and screws;  Surgeon:  Corky Mull, MD;  Location: [facility];  Service: Orthopedics;  Laterality: Right;  removed 5 screws and one plate from right ankle   ORIF ANKLE FRACTURE Right 09/23/2016   Procedure: OPEN REDUCTION INTERNAL FIXATION (ORIF) ANKLE FRACTURE;  Surgeon: Corky Mull, MD;  Location: [facility];  Service: Orthopedics;  Laterality: Right;   SPLENECTOMY, TOTAL N/A 11/02/2014   Procedure: SPLENECTOMY;  Surgeon: Sherri Rad, MD;  Location: [facility];  Service: General;  Laterality: N/A;   TONSILLECTOMY      Family History  Problem Relation Age of Onset   Rectal cancer Mother    Lung cancer Father     Social History   Tobacco Use   Smoking status: Current Every Day Smoker    Packs/day: 1.00    Years: 30.00    Pack years: 30.00    Types: Cigarettes   Smokeless tobacco: Never Used  Substance Use Topics   Alcohol use: No   Drug use: No    Patient Active Problem List   Diagnosis Date Noted   Closed right ankle fracture 09/22/2016   Cigarette smoker 11/27/2014   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   clonazePAM (KLONOPIN) 0.5 MG tablet TAKE 1/2 TO 1 TABLET BY MOUTH EVERY DAY ONLY AS NEEDED FOR ANXIETY   fluticasone (FLONASE) 50 MCG/ACT nasal spray Place into both nostrils daily as needed for allergies or rhinitis.   oxybutynin (DITROPAN) 5 MG tablet Take 5 mg by mouth 2 (two) times daily.   ranitidine (ZANTAC) 150 MG tablet Take 150 mg by mouth daily.   simvastatin (ZOCOR) 10 MG tablet Take 10 mg by mouth at bedtime.    venlafaxine XR (EFFEXOR-XR) 75 MG 24 hr  capsule Take 225 mg by mouth daily with breakfast.     Allergies:   Augmentin [amoxicillin-pot clavulanate]  Review of Systems (ROS): Review of Systems  Constitutional: Negative for chills and fever.  Respiratory: Negative for cough and shortness of breath.   Cardiovascular: Negative for chest pain and palpitations.  Musculoskeletal: Positive for gait problem (2/2 acute pain).       Acute pain in RIGHT knee  Skin: Negative for color change, pallor and rash.  All other systems reviewed and are negative.    Vital Signs: Today's Vitals   01/26/19 1001 01/26/19 1002 01/26/19 1051 01/26/19 1111  BP:      Pulse:      Resp:      Temp:      TempSrc:      SpO2:      Weight:  140 lb (63.5 kg)    PainSc: 7   7  0-No pain    Physical Exam: Physical Exam  Constitutional: She is oriented to person, place, and time and well-developed, well-nourished, and in no distress.  HENT:  Head: Normocephalic and atraumatic.  Mouth/Throat: Mucous membranes are normal.  Eyes: Pupils are equal, round, and reactive to light.  Cardiovascular: Normal rate, regular rhythm, normal heart sounds and intact distal pulses. Exam reveals no gallop and no friction rub.  No murmur heard. Pulmonary/Chest: Effort normal and breath sounds normal. No respiratory distress. She has no wheezes. She has no rales.  Musculoskeletal:     Right knee: She exhibits swelling. She exhibits normal range of motion, no effusion, no ecchymosis, no deformity, normal alignment, no LCL laxity, normal patellar mobility and no MCL laxity. Tenderness found. Lateral joint line tenderness noted.  Neurological: She is alert and oriented to person, place, and time. Gait normal. GCS score is 15.  Skin: Skin is warm and dry. No rash noted.  Psychiatric: Mood, memory, affect and judgment normal.  Nursing note and vitals reviewed.   Urgent Care Treatments / Results:   LABS: PLEASE NOTE: all labs that were ordered this encounter are listed,  however only abnormal results are displayed. Labs Reviewed - No data to display  EKG: -None  RADIOLOGY: Dg Knee Complete 4 Views Right  Result Date: 01/26/2019 CLINICAL DATA:  Right lateral knee pain, twisting injury EXAM: RIGHT KNEE - COMPLETE 4+ VIEW COMPARISON:  None. FINDINGS: No fracture or dislocation is seen. Very mild degenerative changes with sharpening of the tibial spines and a tiny patellofemoral osteophytes. Visualized soft tissues are within normal limits. No suprapatellar knee joint effusion. IMPRESSION: Negative. Electronically Signed   By: Julian Hy M.D.   On: 01/26/2019 10:43    PROCEDURES: Procedures  MEDICATIONS RECEIVED THIS VISIT: Medications - No data to display  PERTINENT CLINICAL COURSE NOTES/UPDATES:   Initial Impression / Assessment and Plan / Urgent Care Course:  Pertinent labs & imaging results that were available during  my care of the patient were personally reviewed by me and considered in my medical decision making (see lab/imaging section of note for values and interpretations).  Kelli Marks is a 58 y.o. female who presents to [facility] today with complaints of pain in her RIGHT knee.   Patient is well appearing overall in clinic today. She does not appear to be in any acute distress. Presenting symptoms (see HPI) and exam as documented above. Diagnostic radiographs reveal no fracture, dislocation, or evidence of an effusion. Discussed that a negative plain film does not rule out the presence of a tendonous or ligamentous injury. Discussed immobilization, however patient refused citing that she feared it would limit her mobility too much. Other options discussed. Patient in agreement with knee sleeve; applied by clinic staff. Discussed supportive care using rest, ice, and elevation. Will prescribe meloxicam to help with pain and inflammation. Patient asking for something stronger to have on hand for severe pain. She has taken Tramadol in  the past for pain that has been "far worse than this" and notes that it did not work for her. Will prescribe a short course of Norco for moderate to severe pain.   Patient will likely need to be seen for further evaluation by orthopedics. Name and office contact information provided on today's AVS for Dr. Hessie Knows. Patient advised the she will need to contact the office to schedule an appointment to be seen.   I have reviewed the follow up and strict return precautions for any new or worsening symptoms. Patient is aware of symptoms that would be deemed urgent/emergent, and would thus require further evaluation either here or in the emergency department. At the time of discharge, she verbalized understanding and consent with the discharge plan as it was reviewed with her. All questions were fielded by provider and/or clinic staff prior to patient discharge.    Final Clinical Impressions / Urgent Care Diagnoses:   Final diagnoses:  Acute pain of right knee    New Prescriptions:  [place] Controlled Substance Registry consulted? Yes, I have consulted the [place] Controlled Substances Registry for this patient, and feel the risk/benefit ratio today is favorable for proceeding with this prescription for a controlled substance.   Discussed use of controlled substance medication to treat her acute pain.  o Reviewed [place] STOP Act regulations  o Clinic does not refill controlled substances over the phone without face to face evaluation.   Safety precautions reviewed.  o Medications should not be bitten, chewed, crushed, shared, or taken with alcohol.  o Avoid use while working, driving, or operating heavy machinery.  o Side effects associated with the use of this particular medication reviewed. - Patient understands that this medication can cause CNS depression, increase her risk of falls, and even lead to overdose that may result in death, if used outside of the parameters that she and I discussed.  With all  of this in mind, she knowingly accepts the risks and responsibilities associated with intended course of treatment, and elects to responsibly proceed as discussed.  Meds ordered this encounter  Medications   meloxicam (MOBIC) 15 MG tablet    Sig: Take 1 tablet (15 mg total) by mouth daily.    Dispense:  30 tablet    Refill:  0   HYDROcodone-acetaminophen (NORCO) 5-325 MG tablet    Sig: Take 1 tablet by mouth 2 (two) times daily as needed for moderate pain.    Dispense:  8 tablet  Refill:  0    Recommended Follow up Care:  Patient encouraged to follow up with the following provider within the specified time frame, or sooner as dictated by the severity of her symptoms. As always, she was instructed that for any urgent/emergent care needs, she should seek care either here or in the emergency department for more immediate evaluation.  Follow-up Information    Hessie Knows, MD In 1 week.   Specialty: Orthopedic Surgery Why: General reassessment of symptoms if not improving Contact information: [address] [facility] [place]  [id number] [phone]         NOTE: This note was prepared using Dragon dictation software along with smaller phrase technology. Despite my best ability to proofread, there is the potential that transcriptional errors may still occur from this process, and are completely unintentional.    Karen Kitchens, NP 01/26/19 2011

## 2019-01-26 NOTE — ED Triage Notes (Signed)
Pt states she stepped up on the curb yesterday and heard something pop in her right leg and now having pain behind her right knee down her leg into mid leg. Feels like a sharp shooting pain such as a charlie horse.

## 2019-01-26 NOTE — Discharge Instructions (Signed)
It was very nice seeing you today in clinic. Thank you for entrusting me with your care.   Wear splint. REST, ICE, and elevate knee. Please utilize the medications that we discussed. Your prescriptions have been called in to your pharmacy.   Make arrangements to follow up with orthopedics in 1 week for re-evaluation if not improving. I have provided you the name and office contact information for an excellent local provider. If your symptoms/condition worsens, please seek follow up care either here or in the ER. Please remember, our [place] providers are "right here with you" when you need [place].   Again, it was my pleasure to take care of you today. Thank you for choosing our clinic. I hope that you start to feel better quickly.   Honor Loh, MSN, APRN, FNP-C, CEN Advanced Practice Provider [facility]

## 2019-11-18 NOTE — Progress Notes (Signed)
[facility]  [address] [place], [place] [address] Phone: [phone]  Fax: [phone]   Clinic Day:  11/20/2019  Referring physician: Baldo Ash*  Chief Complaint: Kelli Marks is a 59 y.o. female with leukocytosis who is referred in consultation by Dr. Barbette Reichmann for assessment and management.   HPI: The patient saw Dr. Barbette Reichmann for a telemedicine visit on 10/16/2019. She was doing fairly well. She noticed her energy level was decreasing for 2 months. She reported being less active along with social withdrawal. She was taking gabapentin as needed. She was using Klonopin sparingly.   CBC followed: 02/17/2014: Hematocrit 39.1, hemoglobin 13.0, platelets 236,000, WBC 8,900. 10/31/2014: Hematocrit 21.9, hemoglobin   7.5, platelets 189,000, WBC 13,500 (ANC 10,600).  Monocyte count 1200. 09/22/2016: Hematocrit 40.7, hemoglobin 13.5, platelets 436,000, WBC 25,400 (ANC 20,500).  Monocyte count 1400. 09/24/2016: Hematocrit 34.4, hemoglobin 11.6, platelets 355,000, WBC 15,800 (ANC 10,800).  Monocyte count 1900. 11/30/2017: Hematocrit 42.0, hemoglobin 14.0, platelets 399,000, WBC 16,200.  10/18/2019: Hematocrit 42.8, hemoglobin 14.4, platelets 455,000, WBC 19,100 (ANC 12,500; ALC 4900).  Monocyte count 1500.  Additional labs on 10/18/2019: Vitamin B12 was 349 and folate >45.0. TSH was 0.58.  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  The patient has a history of smoking. She has a history of depression and generalized anxiety disorder.   Symptomatically, she feels tired. Her energy has been low for several months. She has trouble sleeping. She denies sleep apnea and snoring. She feels anti-social and she is unsure if this could be related to depression. She has a 43 year smoking history. The patient agreed to the low dose chest CT lung cancer screening program.    She described an unintentional weight loss of 8-10 pounds in the last few months. She drinks a lot of water and gets full quickly. She has never been a big eater and more recently she has been eating 1 meal a day. She has a good diet. She denies ice pica and restless legs. She is taking a multi-vitamin. She is not in any oral steroids. She had a left shoulder injection 2 months ago.   She denies any B symptoms. She bruises easily. She has not taken her apsirin in the last month. She has headaches related to allergies. She has a dry cough due to sinuses. She had no bloody or black stools. She reports frequent urination. She has poor balance. She used to go to ENT for polyps in her throat. Her voices sounds hoarse. I discussed need for follow up with ENT.  She has regular colonoscopies. Colonoscopy was normal in 2020 with Dr. Tiffany Kocher. She is due for a mammogram. She denies any breast concerns. She examines her breast every month. I encouraged her to undergo yearly mammogram.   Patient received the Moderna COVID-19 vaccine in 06/2019 and 07/2019.    Past Medical History:  Diagnosis Date  . Arthritis   . GERD (gastroesophageal reflux disease)   . Hypercholesteremia   . Hyperlipidemia   . PONV (postoperative nausea and vomiting)   . Rib fractures   . Wears dentures    full upper and lower    Past Surgical History:  Procedure Laterality Date  . ABDOMINAL HYSTERECTOMY    . ANKLE SURGERY Left   . CHOLECYSTECTOMY    . HARDWARE REMOVAL Right 01/18/2017   Procedure: HARDWARE REMOVAL right tibia tibral plate and screws;  Surgeon: Corky Mull,  MD;  Location: [facility];  Service: Orthopedics;  Laterality: Right;  removed 5 screws and one plate from right ankle  . ORIF ANKLE FRACTURE Right 09/23/2016   Procedure: OPEN REDUCTION INTERNAL FIXATION (ORIF) ANKLE FRACTURE;  Surgeon: Corky Mull, MD;  Location: [facility];  Service: Orthopedics;  Laterality: Right;  . SPLENECTOMY, TOTAL N/A  11/02/2014   Procedure: SPLENECTOMY;  Surgeon: Sherri Rad, MD;  Location: [facility];  Service: General;  Laterality: N/A;  . TONSILLECTOMY      Family History  Problem Relation Age of Onset  . Rectal cancer Mother   . Lymphoma Mother   . Lung cancer Father     Social History:  reports that she has been smoking cigarettes. She has a 30.00 pack-year smoking history. She has never used smokeless tobacco. She reports that she does not drink alcohol and does not use drugs.  She smokes 1 pack per day. She has been smoking since the age of 27 (32 years). She does not drink alcohol. Her mother had rectal cancer and lymphoma. Her father had lung cancer. She reports that her uncle had leukemia. She denies any expousres to radiation or toxins. She lives in [place], [place]. 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 The patient is alone today.   Allergies:  Allergies  Allergen Reactions  . Augmentin [Amoxicillin-Pot Clavulanate] Nausea Only    Current Medications: Current Outpatient Medications  Medication Sig Dispense Refill  . aspirin EC 81 MG tablet Take 81 mg by mouth daily.    . clonazePAM (KLONOPIN) 0.5 MG tablet TAKE 1/2 TO 1 TABLET BY MOUTH EVERY DAY ONLY AS NEEDED FOR ANXIETY  5  . ferrous sulfate 325 (65 FE) MG tablet Take by mouth.    . fluticasone (FLONASE) 50 MCG/ACT nasal spray Place into both nostrils daily as needed for allergies or rhinitis.    Marland Kitchen gabapentin (NEURONTIN) 300 MG capsule TITRATE SLOWLY UP TO TAKING 1 CAPSULE BY MOUTH THREE TIMES A DAY    . meloxicam (MOBIC) 15 MG tablet Take 1 tablet (15 mg total) by mouth daily. 30 tablet 0  . oxybutynin (DITROPAN) 5 MG tablet Take 5 mg by mouth 2 (two) times daily.    . ranitidine (ZANTAC) 150 MG tablet Take 150 mg by mouth daily.    . simvastatin (ZOCOR) 10 MG tablet Take 10 mg by mouth at bedtime.     Marland Kitchen HYDROcodone-acetaminophen (NORCO) 5-325 MG tablet Take 1 tablet by mouth 2 (two) times daily as needed for moderate pain. (Patient not  taking: Reported on 11/20/2019) 8 tablet 0  . venlafaxine XR (EFFEXOR-XR) 75 MG 24 hr capsule Take 225 mg by mouth daily with breakfast.      No current facility-administered medications for this visit.    Review of Systems  Constitutional: Positive for malaise/fatigue (ongoing for several months) and weight loss (8-10 pounds in past few months; unintentional). Negative for chills, diaphoresis and fever.  HENT: Negative for congestion, ear discharge, ear pain, hearing loss, nosebleeds, sinus pain, sore throat and tinnitus.        H/o throat polyps. Hoarse voice.  Eyes: Negative for blurred vision.  Respiratory: Positive for cough (dry, sinus related). Negative for hemoptysis, sputum production and shortness of breath.        No sleep apnea.  Cardiovascular: Negative for chest pain, palpitations and claudication.  Gastrointestinal: Negative for abdominal pain, blood in stool, constipation, diarrhea, heartburn, melena, nausea and vomiting.  Good diet. Eating 1 meal a day. Drinking plenty water; early satiety. No ice pica.  Genitourinary: Positive for frequency. Negative for dysuria, hematuria and urgency.  Musculoskeletal: Negative for back pain, joint pain, myalgias and neck pain.       Left shoulder injections.  Skin: Negative for itching and rash.  Neurological: Positive for headaches (allergy related). Negative for dizziness, tingling, sensory change and weakness.       No restless legs. Poor balance.  Endo/Heme/Allergies: Positive for environmental allergies. Bruises/bleeds easily (bruising only).  Psychiatric/Behavioral: Positive for depression. Negative for hallucinations. The patient is nervous/anxious (on klonopin) and has insomnia (trouble sleeping).   All other systems reviewed and are negative.  Performance status (ECOG): 1-2  Vitals Blood pressure 135/69, pulse 80, temperature (!) 96.1 F (35.6 C), temperature source Tympanic, resp. rate 16, weight 131 lb 15.1 oz (59.8  kg), SpO2 99 %.   Physical Exam Vitals and nursing note reviewed.  Constitutional:      General: She is not in acute distress.    Appearance: Normal appearance.     Interventions: Face mask in place.  HENT:     Head: Normocephalic and atraumatic.     Comments: Short styled blonde and brown hair.    Mouth/Throat:     Mouth: Mucous membranes are moist.     Pharynx: Oropharynx is clear.     Comments: Dentures. Eyes:     General: No scleral icterus.    Extraocular Movements: Extraocular movements intact.     Conjunctiva/sclera: Conjunctivae normal.     Pupils: Pupils are equal, round, and reactive to light.     Comments: Thick dark brown rimmed glasses.  Cardiovascular:     Rate and Rhythm: Normal rate and regular rhythm.     Pulses: Normal pulses.     Heart sounds: Normal heart sounds. No murmur heard.   Pulmonary:     Effort: Pulmonary effort is normal. No respiratory distress.     Breath sounds: Normal breath sounds. No wheezing or rales.  Chest:     Chest wall: No tenderness.  Abdominal:     General: Bowel sounds are normal. There is no distension.     Palpations: Abdomen is soft. There is no mass.     Tenderness: There is no abdominal tenderness. There is no guarding.  Musculoskeletal:        General: No swelling or tenderness. Normal range of motion.     Cervical back: Normal range of motion and neck supple.  Lymphadenopathy:     Head:     Right side of head: No preauricular, posterior auricular or occipital adenopathy.     Left side of head: No preauricular, posterior auricular or occipital adenopathy.     Cervical: No cervical adenopathy.     Upper Body:     Right upper body: No supraclavicular or axillary adenopathy.     Left upper body: No supraclavicular or axillary adenopathy.     Lower Body: No right inguinal adenopathy. No left inguinal adenopathy.  Skin:    General: Skin is warm and dry.     Comments: Cancer ribbon tattoo on left wrist.  Neurological:      Mental Status: She is alert and oriented to person, place, and time. Mental status is at baseline.  Psychiatric:        Mood and Affect: Mood normal.        Behavior: Behavior normal.        Thought Content: Thought content normal.  Judgment: Judgment normal.    No visits with results within 3 Day(s) from this visit.  Latest known visit with results is:  Admission on 09/22/2016, Discharged on 09/24/2016  Component Date Value Ref Range Status  . Sodium 09/22/2016 136  135 - 145 mmol/L Final  . Potassium 09/22/2016 4.2  3.5 - 5.1 mmol/L Final  . Chloride 09/22/2016 101  101 - 111 mmol/L Final  . CO2 09/22/2016 28  22 - 32 mmol/L Final  . Glucose, Bld 09/22/2016 107* 65 - 99 mg/dL Final  . BUN 09/22/2016 8  6 - 20 mg/dL Final  . Creatinine, Ser 09/22/2016 0.69  0.44 - 1.00 mg/dL Final  . Calcium 09/22/2016 9.3  8.9 - 10.3 mg/dL Final  . GFR calc non Af Amer 09/22/2016 >60  >60 mL/min Final  . GFR calc Af Amer 09/22/2016 >60  >60 mL/min Final   Comment: (NOTE) The eGFR has been calculated using the CKD EPI equation. This calculation has not been validated in all clinical situations. eGFR's persistently <60 mL/min signify possible Chronic Kidney Disease.   . Anion gap 09/22/2016 7  5 - 15 Final  . WBC 09/22/2016 25.4* 3.6 - 11.0 K/uL Final  . RBC 09/22/2016 4.41  3.80 - 5.20 MIL/uL Final  . Hemoglobin 09/22/2016 13.5  12.0 - 16.0 g/dL Final  . HCT 09/22/2016 40.7  35 - 47 % Final  . MCV 09/22/2016 92.3  80.0 - 100.0 fL Final  . MCH 09/22/2016 30.8  26.0 - 34.0 pg Final  . MCHC 09/22/2016 33.3  32.0 - 36.0 g/dL Final  . RDW 09/22/2016 13.3  11.5 - 14.5 % Final  . Platelets 09/22/2016 436  150 - 440 K/uL Final  . Neutrophils Relative % 09/22/2016 81  % Final  . Neutro Abs 09/22/2016 20.5* 1.4 - 6.5 K/uL Final  . Lymphocytes Relative 09/22/2016 13  % Final  . Lymphs Abs 09/22/2016 3.3  1.0 - 3.6 K/uL Final  . Monocytes Relative 09/22/2016 6  % Final  . Monocytes Absolute  09/22/2016 1.4* 0 - 0 K/uL Final  . Eosinophils Relative 09/22/2016 0  % Final  . Eosinophils Absolute 09/22/2016 0.1  0 - 0 K/uL Final  . Basophils Relative 09/22/2016 0  % Final  . Basophils Absolute 09/22/2016 0.0  0 - 0 K/uL Final  . MRSA, PCR 09/23/2016 NEGATIVE  NEGATIVE Final  . Staphylococcus aureus 09/23/2016 NEGATIVE  NEGATIVE Final   Comment:        The Xpert SA Assay (FDA approved for NASAL specimens in patients over 63 years of age), is one component of a comprehensive surveillance program.  Test performance has been validated by [facility] for patients greater than or equal to 67 year old. It is not intended to diagnose infection nor to guide or monitor treatment.   . WBC 09/24/2016 15.8* 3.6 - 11.0 K/uL Final  . RBC 09/24/2016 3.66* 3.80 - 5.20 MIL/uL Final  . Hemoglobin 09/24/2016 11.6* 12.0 - 16.0 g/dL Final  . HCT 09/24/2016 34.4* 35 - 47 % Final  . MCV 09/24/2016 94.2  80.0 - 100.0 fL Final  . MCH 09/24/2016 31.7  26.0 - 34.0 pg Final  . MCHC 09/24/2016 33.7  32.0 - 36.0 g/dL Final  . RDW 09/24/2016 13.6  11.5 - 14.5 % Final  . Platelets 09/24/2016 355  150 - 440 K/uL Final  . Neutrophils Relative % 09/24/2016 69  % Final  . Neutro Abs 09/24/2016 10.8* 1.4 - 6.5 K/uL Final  .  Lymphocytes Relative 09/24/2016 17  % Final  . Lymphs Abs 09/24/2016 2.7  1.0 - 3.6 K/uL Final  . Monocytes Relative 09/24/2016 12  % Final  . Monocytes Absolute 09/24/2016 1.9* 0 - 0 K/uL Final  . Eosinophils Relative 09/24/2016 1  % Final  . Eosinophils Absolute 09/24/2016 0.2  0 - 0 K/uL Final  . Basophils Relative 09/24/2016 1  % Final  . Basophils Absolute 09/24/2016 0.2* 0 - 0 K/uL Final  . Sodium 09/24/2016 140  135 - 145 mmol/L Final  . Potassium 09/24/2016 4.0  3.5 - 5.1 mmol/L Final  . Chloride 09/24/2016 106  101 - 111 mmol/L Final  . CO2 09/24/2016 30  22 - 32 mmol/L Final  . Glucose, Bld 09/24/2016 110* 65 - 99 mg/dL Final  . BUN 09/24/2016 8  6 - 20 mg/dL Final  .  Creatinine, Ser 09/24/2016 0.74  0.44 - 1.00 mg/dL Final  . Calcium 09/24/2016 8.3* 8.9 - 10.3 mg/dL Final  . GFR calc non Af Amer 09/24/2016 >60  >60 mL/min Final  . GFR calc Af Amer 09/24/2016 >60  >60 mL/min Final   Comment: (NOTE) The eGFR has been calculated using the CKD EPI equation. This calculation has not been validated in all clinical situations. eGFR's persistently <60 mL/min signify possible Chronic Kidney Disease.   . Anion gap 09/24/2016 4* 5 - 15 Final    Assessment:  BRANDE UNCAPHER is a 59 y.o. female with leukocytosis since 2016.  WBC has ranged between 13,500 - 25,400 without trend.  Differential is predominantly neutrophils.  Monocyte count has ranged between 1200 -1900.  She has not received oral steroids, but 2 months ago received a steroid injection in her left shoulder.  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  Pathology revealed splenic laceration with capsular and subcapsular hematoma.  She has a 50+ pack year smoking history.  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  Colonoscopy was normal in 2020. She is due for a mammogram.  Patient received the Moderna COVID-19 vaccine in 06/2019 and 07/2019.   Symptomatically, she feels tired.  She has lost 8-10 pounds over the last few months.  She is hoarse.  Exam reveals no adenopathy or hepatosplenomegaly.  Plan: 1.   Labs today:  CBC with diff, CMP, LDH, BCR-ABL, flow cytometry. 2.   Leukocytosis  First noted after splenectomy.  WBC has been elevated without trend.  Monocyte count elevated.    Smoking can increase WBC.  Discuss laboratory work-up. 3.   Smoking history  Patient has a 50+ pack year smoking history .  Refer to the low dose chest CT program.  RN to contact [facility] re: low dose CT program.  4.   Hoarse  Discuss follow-up with ENT. 5.   Health maintenance  Encourage yearly mammogram. 6.   RTC in 10 days for MD assessment,  review of work-up and discussion regarding direction of therapy.   I discussed the assessment and treatment plan with the patient.  The patient was provided an opportunity to ask questions and all were answered.  The patient agreed with the plan and demonstrated an understanding of the instructions.  The patient was advised to call back if the symptoms worsen or if the condition fails to improve as anticipated.  I provided 23 minutes of face-to-face time during this encounter and > 50% was spent counseling as documented under my assessment and plan. An  additional 10 minutes were spent reviewing her chart ([facility] and [facility]) including notes, labs, and imaging studies.    Naava Janeway C. Mike Gip, MD, PhD    11/20/2019, 10:55 AM  I, Selena Batten, am acting as scribe for Kane. Mike Gip, MD, PhD.  I, Anastasha Ortez C. Mike Gip, MD, have reviewed the above documentation for accuracy and completeness, and I agree with the above.

## 2019-11-20 ENCOUNTER — Inpatient Hospital Stay
Payer: No Typology Code available for payment source | Attending: Hematology and Oncology | Admitting: Hematology and Oncology

## 2019-11-20 ENCOUNTER — Other Ambulatory Visit: Payer: Self-pay

## 2019-11-20 ENCOUNTER — Inpatient Hospital Stay: Payer: No Typology Code available for payment source

## 2019-11-20 ENCOUNTER — Encounter: Payer: Self-pay | Admitting: Hematology and Oncology

## 2019-11-20 ENCOUNTER — Telehealth: Payer: Self-pay

## 2019-11-20 VITALS — BP 135/69 | HR 80 | Temp 96.1°F | Resp 16 | Wt 131.9 lb

## 2019-11-20 DIAGNOSIS — D72829 Elevated white blood cell count, unspecified: Secondary | ICD-10-CM | POA: Diagnosis not present

## 2019-11-20 DIAGNOSIS — Z801 Family history of malignant neoplasm of trachea, bronchus and lung: Secondary | ICD-10-CM | POA: Insufficient documentation

## 2019-11-20 DIAGNOSIS — E785 Hyperlipidemia, unspecified: Secondary | ICD-10-CM | POA: Insufficient documentation

## 2019-11-20 DIAGNOSIS — Z79899 Other long term (current) drug therapy: Secondary | ICD-10-CM | POA: Insufficient documentation

## 2019-11-20 DIAGNOSIS — Z90710 Acquired absence of both cervix and uterus: Secondary | ICD-10-CM | POA: Insufficient documentation

## 2019-11-20 DIAGNOSIS — Z9081 Acquired absence of spleen: Secondary | ICD-10-CM | POA: Diagnosis not present

## 2019-11-20 DIAGNOSIS — E871 Hypo-osmolality and hyponatremia: Secondary | ICD-10-CM | POA: Insufficient documentation

## 2019-11-20 DIAGNOSIS — Z807 Family history of other malignant neoplasms of lymphoid, hematopoietic and related tissues: Secondary | ICD-10-CM | POA: Insufficient documentation

## 2019-11-20 DIAGNOSIS — E7800 Pure hypercholesterolemia, unspecified: Secondary | ICD-10-CM | POA: Insufficient documentation

## 2019-11-20 DIAGNOSIS — R490 Dysphonia: Secondary | ICD-10-CM | POA: Insufficient documentation

## 2019-11-20 DIAGNOSIS — Z791 Long term (current) use of non-steroidal anti-inflammatories (NSAID): Secondary | ICD-10-CM | POA: Diagnosis not present

## 2019-11-20 DIAGNOSIS — F17210 Nicotine dependence, cigarettes, uncomplicated: Secondary | ICD-10-CM | POA: Diagnosis not present

## 2019-11-20 DIAGNOSIS — Z7982 Long term (current) use of aspirin: Secondary | ICD-10-CM | POA: Diagnosis not present

## 2019-11-20 DIAGNOSIS — Z800 Family history of malignant neoplasm of digestive organs: Secondary | ICD-10-CM | POA: Insufficient documentation

## 2019-11-20 DIAGNOSIS — F329 Major depressive disorder, single episode, unspecified: Secondary | ICD-10-CM | POA: Insufficient documentation

## 2019-11-20 LAB — CBC WITH DIFFERENTIAL/PLATELET
Abs Immature Granulocytes: 0.05 10*3/uL (ref 0.00–0.07)
Basophils Absolute: 0.2 10*3/uL — ABNORMAL HIGH (ref 0.0–0.1)
Basophils Relative: 2 %
Eosinophils Absolute: 0.2 10*3/uL (ref 0.0–0.5)
Eosinophils Relative: 1 %
HCT: 40.8 % (ref 36.0–46.0)
Hemoglobin: 14 g/dL (ref 12.0–15.0)
Immature Granulocytes: 0 %
Lymphocytes Relative: 38 %
Lymphs Abs: 5.2 10*3/uL — ABNORMAL HIGH (ref 0.7–4.0)
MCH: 31.6 pg (ref 26.0–34.0)
MCHC: 34.3 g/dL (ref 30.0–36.0)
MCV: 92.1 fL (ref 80.0–100.0)
Monocytes Absolute: 1 10*3/uL (ref 0.1–1.0)
Monocytes Relative: 7 %
Neutro Abs: 7.2 10*3/uL (ref 1.7–7.7)
Neutrophils Relative %: 52 %
Platelets: 450 10*3/uL — ABNORMAL HIGH (ref 150–400)
RBC: 4.43 MIL/uL (ref 3.87–5.11)
RDW: 12.7 % (ref 11.5–15.5)
WBC: 13.8 10*3/uL — ABNORMAL HIGH (ref 4.0–10.5)
nRBC: 0 % (ref 0.0–0.2)

## 2019-11-20 LAB — SEDIMENTATION RATE: Sed Rate: 2 mm/hr (ref 0–30)

## 2019-11-20 LAB — LACTATE DEHYDROGENASE: LDH: 95 U/L — ABNORMAL LOW (ref 98–192)

## 2019-11-20 LAB — COMPREHENSIVE METABOLIC PANEL
ALT: 12 U/L (ref 0–44)
AST: 16 U/L (ref 15–41)
Albumin: 4 g/dL (ref 3.5–5.0)
Alkaline Phosphatase: 59 U/L (ref 38–126)
Anion gap: 6 (ref 5–15)
BUN: <5 mg/dL — ABNORMAL LOW (ref 6–20)
CO2: 28 mmol/L (ref 22–32)
Calcium: 9 mg/dL (ref 8.9–10.3)
Chloride: 96 mmol/L — ABNORMAL LOW (ref 98–111)
Creatinine, Ser: 0.65 mg/dL (ref 0.44–1.00)
GFR calc Af Amer: >60 mL/min (ref >60)
GFR calc non Af Amer: >60 mL/min (ref >60)
Glucose, Bld: 83 mg/dL (ref 70–99)
Potassium: 4.3 mmol/L (ref 3.5–5.1)
Sodium: 130 mmol/L — ABNORMAL LOW (ref 135–145)
Total Bilirubin: 0.2 mg/dL — ABNORMAL LOW (ref 0.3–1.2)
Total Protein: 6.8 g/dL (ref 6.5–8.1)

## 2019-11-20 NOTE — Telephone Encounter (Signed)
Patient aware. Labs sent to PCP 

## 2019-11-20 NOTE — Patient Instructions (Signed)
  Patient to have mammogram (arranged by primary care physician).

## 2019-11-20 NOTE — Telephone Encounter (Signed)
-----   Message from Lequita Asal, MD sent at 11/20/2019  1:13 PM EDT ----- Regarding: Please call patient and PCP.  FAX labs to PCP  Sodium is 130.  Discuss fluids with electrolytes.  Hopefully we can get her low dose chest CT soon.  M ----- Message ----- From: Buel Ream, Lab [facility] [place] Sent: 11/20/2019  11:36 AM EDT To: Lequita Asal, MD

## 2019-11-22 ENCOUNTER — Telehealth: Payer: Self-pay | Admitting: *Deleted

## 2019-11-22 ENCOUNTER — Encounter: Payer: Self-pay | Admitting: *Deleted

## 2019-11-22 DIAGNOSIS — Z87891 Personal history of nicotine dependence: Secondary | ICD-10-CM

## 2019-11-22 DIAGNOSIS — Z122 Encounter for screening for malignant neoplasm of respiratory organs: Secondary | ICD-10-CM

## 2019-11-22 NOTE — Telephone Encounter (Signed)
Received referral for initial lung cancer screening scan. Contacted patient and obtained smoking history,(current, 53.75 pack year) as well as answering questions related to screening process. Patient denies signs of lung cancer such as weight loss or hemoptysis. Patient denies comorbidity that would prevent curative treatment if lung cancer were found. Patient is scheduled for shared decision making visit and CT scan on 12/10/19 at 145pm.

## 2019-11-25 LAB — COMP PANEL: LEUKEMIA/LYMPHOMA

## 2019-12-02 LAB — BCR-ABL1 FISH
Cells Analyzed: 200
Cells Counted: 200

## 2019-12-03 ENCOUNTER — Other Ambulatory Visit: Payer: Self-pay

## 2019-12-03 NOTE — Progress Notes (Signed)
Patient has no questions or concerns today.

## 2019-12-03 NOTE — Progress Notes (Signed)
[facility]  [address], [address] [place], [place] [address] Phone: [phone]  Fax: [phone]   Clinic Day:  12/03/2019  Referring physician: Baldo Ash*  Chief Complaint: Kelli Marks is a 59 y.o. female with leukocytosis who is seen today for review of work up and discussion regarding direction of therapy.  HPI: The patient was last seen in the hematology clinic on 11/20/2019 for her initial consultation. At that time, she felt tired.  She had lost 8-10 pounds in a few months.  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  WBC had ranged between 13,500 - 25,400 without trend.  Differential was predominantly neutrophils.  Monocyte count had ranged between 1200 -1900.  She has not received oral steroids, but 2 months prior had received a steroid injection in a joint.  She smoked.  She was referred to the low dose chest CT program.  She is scheduled for chest CT on 12/10/2019.  Work-up revealed a hematocrit was 40.8, hemoglobin 14.0, platelets 450,000, WBC 13,800 (ANC 7,200).  Monocyte count was 1000.  Sodium was 130. LFTs were normal.  Sed rate was 2. LDH was 95. Flow cytometry revealed no diagnostic immunophenotypic abnormality.  BCR-ABL was normal.   During the interim, she has still had low energy. Her breathing is fine.  She denies shortness of breath and cough. She eats one main meal per day and eats when she is hungry. She is taking Vitamin B12. She drinks a lot of water. The patient struggles to stay asleep at night because she constantly has to get up to use the bathroom. She has a history of throat polyps and plans to follow up with Dr. Kathyrn Sheriff.   The patient agrees to a CXR today.   Past Medical History:  Diagnosis Date  . Arthritis   . GERD (gastroesophageal reflux disease)   . Hypercholesteremia   . Hyperlipidemia   . PONV (postoperative nausea and  vomiting)   . Rib fractures   . Wears dentures    full upper and lower    Past Surgical History:  Procedure Laterality Date  . ABDOMINAL HYSTERECTOMY    . ANKLE SURGERY Left   . CHOLECYSTECTOMY    . HARDWARE REMOVAL Right 01/18/2017   Procedure: HARDWARE REMOVAL right tibia tibral plate and screws;  Surgeon: Corky Mull, MD;  Location: [facility];  Service: Orthopedics;  Laterality: Right;  removed 5 screws and one plate from right ankle  . ORIF ANKLE FRACTURE Right 09/23/2016   Procedure: OPEN REDUCTION INTERNAL FIXATION (ORIF) ANKLE FRACTURE;  Surgeon: Corky Mull, MD;  Location: [facility];  Service: Orthopedics;  Laterality: Right;  . 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 TONSILLECTOMY      Family History  Problem Relation Age of Onset  . Rectal cancer Mother   . Lymphoma Mother   . Lung cancer Father     Social History:  reports that she has been smoking cigarettes. She has a 53.75 pack-year smoking history. She has never used smokeless tobacco. She reports that she does not drink alcohol and does not use drugs.  She smokes 1 pack per day. She has been smoking since the age of 31 (76 years). She does not drink alcohol. Her mother had rectal cancer and  lymphoma. Her father had lung cancer. She reports that her uncle had leukemia. She denies any expousres to radiation or toxins. She lives in [place], [place]. 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 The patient is alone today.  Allergies:  Allergies  Allergen Reactions  . Augmentin [Amoxicillin-Pot Clavulanate] Nausea Only    Current Medications: Current Outpatient Medications  Medication Sig Dispense Refill  . aspirin EC 81 MG tablet Take 81 mg by mouth daily.    . clonazePAM (KLONOPIN) 0.5 MG tablet TAKE 1/2 TO 1 TABLET BY MOUTH EVERY DAY ONLY AS NEEDED FOR ANXIETY  5  . ferrous sulfate 325 (65 FE) MG tablet Take  by mouth.    . fluticasone (FLONASE) 50 MCG/ACT nasal spray Place into both nostrils daily as needed for allergies or rhinitis.    Marland Kitchen gabapentin (NEURONTIN) 300 MG capsule TITRATE SLOWLY UP TO TAKING 1 CAPSULE BY MOUTH THREE TIMES A DAY    . HYDROcodone-acetaminophen (NORCO) 5-325 MG tablet Take 1 tablet by mouth 2 (two) times daily as needed for moderate pain. (Patient not taking: Reported on 11/20/2019) 8 tablet 0  . meloxicam (MOBIC) 15 MG tablet Take 1 tablet (15 mg total) by mouth daily. 30 tablet 0  . oxybutynin (DITROPAN) 5 MG tablet Take 5 mg by mouth 2 (two) times daily.    . ranitidine (ZANTAC) 150 MG tablet Take 150 mg by mouth daily.    . simvastatin (ZOCOR) 10 MG tablet Take 10 mg by mouth at bedtime.     Marland Kitchen venlafaxine XR (EFFEXOR-XR) 75 MG 24 hr capsule Take 225 mg by mouth daily with breakfast.      No current facility-administered medications for this visit.    Review of Systems  Constitutional: Positive for malaise/fatigue (ongoing for several months). Negative for chills, diaphoresis, fever and weight loss (up 1 lb).  HENT: Negative for congestion, ear discharge, ear pain, hearing loss, nosebleeds, sinus pain, sore throat and tinnitus.        H/o throat polyps.  Eyes: Negative for blurred vision.  Respiratory: Negative for cough, hemoptysis, sputum production and shortness of breath.        No sleep apnea.  Cardiovascular: Negative for chest pain, palpitations and claudication.  Gastrointestinal: Negative for abdominal pain, blood in stool, constipation, diarrhea, heartburn, melena, nausea and vomiting.       Eating 1 meal a day. Drinking a lot of water.  Genitourinary: Positive for frequency. Negative for dysuria, hematuria and urgency.  Musculoskeletal: Negative for back pain, joint pain, myalgias and neck pain.  Skin: Negative for itching and rash.  Neurological: Negative for dizziness, tingling, sensory change, weakness and headaches.       Poor balance "due to age".    Endo/Heme/Allergies: Negative for environmental allergies. Does not bruise/bleed easily.  Psychiatric/Behavioral: Positive for depression. Negative for hallucinations. The patient is nervous/anxious (on klonopin) and has insomnia (trouble sleeping).   All other systems reviewed and are negative.  Performance status (ECOG): 1  Vitals There were no vitals taken for this visit.   Physical Exam Vitals and nursing note reviewed.  Constitutional:      General: She is not in acute distress.    Appearance: Normal appearance.     Interventions: Face mask in place.  HENT:     Head:     Comments: Short styled blonde and brown hair. Eyes:     Comments: Thick dark brown rimmed glasses.  Skin:    General: Skin is warm and dry.  Comments: Cancer ribbon tattoo on left wrist.  Neurological:     Mental Status: She is alert and oriented to person, place, and time. Mental status is at baseline.  Psychiatric:        Mood and Affect: Mood normal.        Behavior: Behavior normal.        Thought Content: Thought content normal.        Judgment: Judgment normal.    No visits with results within 3 Day(s) from this visit.  Latest known visit with results is:  Appointment on 11/20/2019  Component Date Value Ref Range Status  . Sed Rate 11/20/2019 2  0 - 30 mm/hr Final   Performed at [facility], [address], [place], [place] [address]  . Specimen Type 11/20/2019 BLOOD   Final  . Cells Counted 11/20/2019 200   Final  . Cells Analyzed 11/20/2019 200   Final  . FISH Result 11/20/2019 Comment:   Final   NORMAL:  NO BCR OR ABL1 GENE REARRANGEMENT OBSERVED  . Interpretation 11/20/2019 Comment:   Final   Comment: (NOTE)             nuc ish 9q34(ASS1,ABL1)x2,22q11.2(BCRx2)[200].      The fluorescence in situ hybridization (FISH) study was normal. FISH, using unique sequence DNA probes for the ABL1 and BCR gene regions showed two ABL1 signals (red), two control ASS1 gene signals  (aqua) located adjacent to the ABL1 locus at 9q34, and two BCR signals (green) at 22q11.2 in all interphase nuclei examined. There was NO evidence of CML or ALL-associated BCR/ABL1 dual fusion signals in this analysis. .      This analysis is limited to abnormalities detectable by the specific probes included in the study. FISH results should be interpreted within the context of a full cytogenetic analysis and pathology evaluation. .      This test was developed and its performance characteristics determined by [employer]). It has not been cleared or approved by the U.S. Food and Drug Administration. A BCR-ABL1 gene fusion in greater than 3 interphase nuclei in a                           patient with a new clinical diagnosis is considered positive. The DNA probe vendor for this study was [employer], research (physical sciences)).   . Director Review: 11/20/2019 Comment:   Final   Comment: (NOTE) Leonette Monarch, PhD, [facility] Performed At: [facility] [place], [place] [id number] Katina Degree MDPhD [id number]   . LDH 11/20/2019 95* 98 - 192 U/L Final   Performed at [facility], [address] [place],  [address]  . Sodium 11/20/2019 130* 135 - 145 mmol/L Final  . Potassium 11/20/2019 4.3  3.5 - 5.1 mmol/L Final  . Chloride 11/20/2019 96* 98 - 111 mmol/L Final  . CO2 11/20/2019 28  22 - 32 mmol/L Final  . Glucose, Bld 11/20/2019 83  70 - 99 mg/dL Final   Glucose reference range applies only to samples taken after fasting for at least 8 hours.  . BUN 11/20/2019 <5* 6 - 20 mg/dL Final  . Creatinine, Ser 11/20/2019 0.65  0.44 - 1.00 mg/dL Final  . Calcium 11/20/2019 9.0  8.9 - 10.3 mg/dL Final  . Total Protein 11/20/2019 6.8  6.5 - 8.1 g/dL Final  . Albumin 11/20/2019 4.0  3.5 - 5.0  g/dL Final  . AST 11/20/2019 16  15 - 41 U/L Final  . ALT 11/20/2019 12  0 - 44 U/L Final  . Alkaline Phosphatase 11/20/2019 59   38 - 126 U/L Final  . Total Bilirubin 11/20/2019 0.2* 0.3 - 1.2 mg/dL Final  . GFR calc non Af Amer 11/20/2019 >60  >60 mL/min Final  . GFR calc Af Amer 11/20/2019 >60  >60 mL/min Final  . Anion gap 11/20/2019 6  5 - 15 Final   Performed at [facility], [address], [place], [place] [address]  . PATH INTERP XXX-IMP 11/20/2019 Comment   Final   Comment: (NOTE) No significant diagnostic immunophenotypic abnormality detected. Lymphocytosis. (See comment.)   . ANNOTATION COMMENT IMP 11/20/2019 Comment   Corrected   Comment: (NOTE) Lymphocytosis is due to increases in total T cells, T helper cells, T suppressor cells, and B cells. Increases in multiple lymphocyte subsets may suggest a reactive process. Clinical correlation is recommended.   Marland Kitchen CLINICAL INFO 11/20/2019 Comment   Corrected   Comment: (NOTE) Leukocytosis Accompanying CBC dated 11/20/2019 shows: WBC count 13.8, Neu 7.2, Lym 5.2, Mon 1.0.   . Specimen Type 11/20/2019 Comment   Final   Peripheral blood  . ASSESSMENT OF LEUKOCYTES 11/20/2019 Comment   Final   Comment: (NOTE) No monoclonal B cell population is detected. kappa:lambda ratio 2.0 There is no loss of, or aberrant expression of, the pan T cell antigens to suggest a neoplastic T cell process. CD4:CD8 ratio 1.2 No circulating blasts are detected. There is no immunophenotypic evidence of abnormal myeloid maturation. Analysis of the leukocyte population shows: granulocytes 57%, monocytes 4%, lymphocytes 39%, blasts <0.1%, B cells 6%, T cells 32%, NK cells 1%.   . % Viable Cells 11/20/2019 Comment   Corrected   97%  . ANALYSIS AND GATING STRATEGY 11/20/2019 Comment   Final   8 color analysis with CD45/SSC gating  . IMMUNOPHENOTYPING STUDY 11/20/2019 Comment   Final   Comment: (NOTE) CD2       Normal         CD3       Normal CD4       Normal         CD5       Normal CD7       Normal         CD8       Normal CD10      Normal         CD11b      Normal CD13      Normal         CD14      Normal CD16      Normal         CD19      Normal CD20      Normal         CD33      Normal CD34      Normal         CD38      Normal CD45      Normal         CD56      Normal CD57      Normal         CD117     Normal HLA-DR    Normal         KAPPA     Normal LAMBDA    Normal  CD64      Normal   . PATHOLOGIST NAME 11/20/2019 Comment   Final   Cecilie Kicks, M. D.  . COMMENT: 11/20/2019 Comment   Corrected   Comment: (NOTE) Each antibody in this assay was utilized to assess for potential abnormalities of studied cell populations or to characterize identified abnormalities. This test was developed and its performance characteristics determined by [facility].  It has not been cleared or approved by the U.S. Food and Drug Administration. The FDA has determined that such clearance or approval is not necessary. This test is used for clinical purposes.  It should not be regarded as investigational or for research. Performed At: -[facility] [address] [place], [place] [id number] Katina Degree MDPhD [id number] Performed At: [facility] [address] [place], [place] [id number] Katina Degree MDPhD [id number]   . WBC 11/20/2019 13.8* 4.0 - 10.5 K/uL Final  . RBC 11/20/2019 4.43  3.87 - 5.11 MIL/uL Final  . Hemoglobin 11/20/2019 14.0  12.0 - 15.0 g/dL Final  . HCT 11/20/2019 40.8  36 - 46 % Final  . MCV 11/20/2019 92.1  80.0 - 100.0 fL Final  . MCH 11/20/2019 31.6  26.0 - 34.0 pg Final  . MCHC 11/20/2019 34.3  30.0 - 36.0 g/dL Final  . RDW 11/20/2019 12.7  11.5 - 15.5 % Final  . Platelets 11/20/2019 450* 150 - 400 K/uL Final  . nRBC 11/20/2019 0.0  0.0 - 0.2 % Final  . Neutrophils Relative % 11/20/2019 52  % Final  . Neutro Abs 11/20/2019 7.2  1.7 - 7.7 K/uL Final  . Lymphocytes Relative 11/20/2019 38  % Final  . Lymphs Abs 11/20/2019 5.2* 0.7 - 4.0 K/uL Final  . Monocytes Relative 11/20/2019 7  % Final  . Monocytes  Absolute 11/20/2019 1.0  0 - 1 K/uL Final  . Eosinophils Relative 11/20/2019 1  % Final  . Eosinophils Absolute 11/20/2019 0.2  0 - 0 K/uL Final  . Basophils Relative 11/20/2019 2  % Final  . Basophils Absolute 11/20/2019 0.2* 0 - 0 K/uL Final  . Immature Granulocytes 11/20/2019 0  % Final  . Abs Immature Granulocytes 11/20/2019 0.05  0.00 - 0.07 K/uL Final   Performed at [facility], [address], [place], [place] [address]    Assessment:  Kelli Marks is a 59 y.o. female with leukocytosis since 2016.  WBC has ranged between 13,500 - 25,400 without trend.  Differential is predominantly neutrophils.  Monocyte count has ranged between 1200 -1900.  She has not received oral steroids, but 2 months ago received a steroid injection in her left shoulder.  Work-up on 11/20/2019 revealed a hematocrit was 40.8, hemoglobin 14.0, platelets 450,000, WBC 13,800 (ANC 7,200).  Monocyte count was 1000.  Sodium was 130.  Normal studies included LDH, sed rate, flow cytometry and BCR-ABL.   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  Pathology revealed splenic laceration with capsular and subcapsular hematoma.  She has a 50+ pack year smoking history.  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  Colonoscopy was normal in 2020. She is due for a mammogram.  Patient received the Moderna COVID-19 vaccine in 06/2019 and 07/2019.   Symptomatically, she remains fatigued.  She is hoarse.  Platelet count is 450,000.  Plan: 1.   Review labs from 11/20/2019. 2.   Leukocytosis  First noted after splenectomy.  WBC has been  elevated without trend.  Monocyte count elevated.    Etiology is felt reactive.  Work-up reveals a normal BCR-ABL and flow cytometry.  JAK2 with reflex sent today. 3.   Smoking history   She has a 50+ pack year smoking history.  She has been referred to the low-dose chest CT program.  Patient's insurance does not  cover lung cancer screening and would be self-pay.  Burgess Estelle, RN has arranged for Shared Decision Making visit and possible lung cancer screening scan.   CXR today. 4.   Hyponatremia  Sodium 130-131.  Patient notes drinking lots of water.  Labs today:  Urine and serum osmolality, uric acid, TSH 5.   Please schedule appt with Dr Kathyrn Sheriff re: hoarse; h/o polyps. 6.   RTC in 2 weeks for MD assessment and review of additional labs.  Addendum: Chest x-ray today revealed no acute cardiopulmonary disease.  There was a probable tiny left lower lobe calcified granuloma.  I discussed the assessment and treatment plan with the patient.  The patient was provided an opportunity to ask questions and all were answered.  The patient agreed with the plan and demonstrated an understanding of the instructions.  The patient was advised to call back if the symptoms worsen or if the condition fails to improve as anticipated.    [place] C. Mike Gip, MD, PhD    12/03/2019, 9:01 AM  I, Mirian Mo Tufford, am acting as Education administrator for [employer]. Mike Gip, MD, PhD.  I, Corderius Saraceni C. Mike Gip, MD, have reviewed the above documentation for accuracy and completeness, and I agree with the above.

## 2019-12-04 ENCOUNTER — Ambulatory Visit
Admission: RE | Admit: 2019-12-04 | Discharge: 2019-12-04 | Disposition: A | Discharge status: Home / Self Care | Payer: No Typology Code available for payment source | Source: Ambulatory Visit | Attending: Hematology and Oncology | Admitting: Hematology and Oncology

## 2019-12-04 ENCOUNTER — Ambulatory Visit
Admission: RE | Admit: 2019-12-04 | Discharge: 2019-12-04 | Disposition: A | Discharge status: Home / Self Care | Payer: No Typology Code available for payment source | Attending: Hematology and Oncology | Admitting: Hematology and Oncology

## 2019-12-04 ENCOUNTER — Inpatient Hospital Stay: Payer: No Typology Code available for payment source

## 2019-12-04 ENCOUNTER — Encounter: Payer: Self-pay | Admitting: Hematology and Oncology

## 2019-12-04 ENCOUNTER — Telehealth: Payer: Self-pay | Admitting: *Deleted

## 2019-12-04 ENCOUNTER — Inpatient Hospital Stay (HOSPITAL_BASED_OUTPATIENT_CLINIC_OR_DEPARTMENT_OTHER): Payer: No Typology Code available for payment source | Admitting: Hematology and Oncology

## 2019-12-04 VITALS — BP 140/62 | HR 89 | Temp 97.5°F | Resp 18 | Ht 67.0 in | Wt 132.6 lb

## 2019-12-04 DIAGNOSIS — D473 Essential (hemorrhagic) thrombocythemia: Secondary | ICD-10-CM

## 2019-12-04 DIAGNOSIS — D75839 Thrombocytosis, unspecified: Secondary | ICD-10-CM

## 2019-12-04 DIAGNOSIS — D72829 Elevated white blood cell count, unspecified: Secondary | ICD-10-CM

## 2019-12-04 DIAGNOSIS — R05 Cough: Secondary | ICD-10-CM

## 2019-12-04 DIAGNOSIS — E871 Hypo-osmolality and hyponatremia: Secondary | ICD-10-CM

## 2019-12-04 DIAGNOSIS — R059 Cough, unspecified: Secondary | ICD-10-CM

## 2019-12-04 DIAGNOSIS — F17210 Nicotine dependence, cigarettes, uncomplicated: Secondary | ICD-10-CM | POA: Diagnosis not present

## 2019-12-04 LAB — BASIC METABOLIC PANEL
Anion gap: 8 (ref 5–15)
BUN: <5 mg/dL — ABNORMAL LOW (ref 6–20)
CO2: 29 mmol/L (ref 22–32)
Calcium: 8.7 mg/dL — ABNORMAL LOW (ref 8.9–10.3)
Chloride: 94 mmol/L — ABNORMAL LOW (ref 98–111)
Creatinine, Ser: 0.82 mg/dL (ref 0.44–1.00)
GFR calc Af Amer: >60 mL/min (ref >60)
GFR calc non Af Amer: >60 mL/min (ref >60)
Glucose, Bld: 82 mg/dL (ref 70–99)
Potassium: 4.1 mmol/L (ref 3.5–5.1)
Sodium: 131 mmol/L — ABNORMAL LOW (ref 135–145)

## 2019-12-04 LAB — SODIUM, URINE, RANDOM: Sodium, Ur: <10 mmol/L

## 2019-12-04 LAB — OSMOLALITY, URINE: Osmolality, Ur: 59 mOsm/kg — ABNORMAL LOW (ref 300–900)

## 2019-12-04 LAB — OSMOLALITY: Osmolality: 271 mOsm/kg — ABNORMAL LOW (ref 275–295)

## 2019-12-04 LAB — TSH: TSH: 0.513 u[IU]/mL (ref 0.350–4.500)

## 2019-12-04 LAB — URIC ACID: Uric Acid, Serum: 2.9 mg/dL (ref 2.5–7.1)

## 2019-12-04 NOTE — Telephone Encounter (Signed)
  Shawn,  Thanks for all your work on this.  If she is unable to get the scan, please let me know and I'll order a regular CXR.  M

## 2019-12-04 NOTE — Telephone Encounter (Signed)
Informed by business office that patient's insurance does not cover lung cancer screening and would be a self pay situation. After discussing with patient contacted Etheleen Sia who has grant funding available for lung cancer screening at a [employer]. Etheleen Sia will arrange for Shared Decision Making Visit and lung cancer screening scan. Patient knows to call me for any questions or concerns.

## 2019-12-10 ENCOUNTER — Inpatient Hospital Stay: Payer: No Typology Code available for payment source | Admitting: Oncology

## 2019-12-10 ENCOUNTER — Ambulatory Visit: Payer: No Typology Code available for payment source

## 2019-12-12 LAB — JAK2 V617F, W REFLEX TO CALR/E12/MPL

## 2019-12-12 LAB — CALR + JAK2 E12-15 + MPL (REFLEXED)

## 2019-12-22 NOTE — Progress Notes (Signed)
[facility]  [address], [address] [place], [place] [address] Phone: [phone]  Fax: [phone]   Clinic Day:  12/23/2019  Referring physician: Baldo Ash*  Chief Complaint: [name] is a 59 y.o. female with leukocytosis who is seen for MD assessment and review of additional labs.   HPI: The patient was last seen in the hematology clinic on 12/04/2019. At that time, she remained fatigued.  She was hoarse. Platelet count was 450,000. CXR showed revealed no acute cardiopulmonary disease.  There was a probable tiny left lower lobe calcified granuloma.  Additional labs included sodium 131.  Serum osmolality was 271 (275-295) and urine osmolality 59 (300-900).  Urine sodium was < 10.  TSH was 0.513 (normal). Uric acid serum 2.9 (low). JAK2 V617F, exon 12-15, CALR and MPL was negative.  She spoke with Burgess Estelle, RN about the lung cancer screening program.  The patient's insurance does not cover lung cancer screening and would be self pay.  He spoke with Etheleen Sia who has grant funding available for lung cancer screening at a [facility] radiology facility.  Etheleen Sia was to arrange for Shared Decision Mking Visit and lung cancer screening scan.  During the interim, she has felt "tired". Her balance is poor. Her mood is the same. She saw Dr. Kathyrn Sheriff 1 week ago; she has an upcoming allergy test. She continues to smoke a littler over 1 ppd. She is drinking a lot of water. She is eating one meal a day.   Patient does not have a CT scan set up for lung cancer screening.    Past Medical History:  Diagnosis Date  . Arthritis   . GERD (gastroesophageal reflux disease)   . Hypercholesteremia   . Hyperlipidemia   . PONV (postoperative nausea and vomiting)   . Rib fractures   . Wears dentures    full upper and lower    Past Surgical History:  Procedure Laterality Date  . ABDOMINAL HYSTERECTOMY    . ANKLE SURGERY Left   .  CHOLECYSTECTOMY    . HARDWARE REMOVAL Right 01/18/2017   Procedure: HARDWARE REMOVAL right tibia tibral plate and screws;  Surgeon: Corky Mull, MD;  Location: [facility];  Service: Orthopedics;  Laterality: Right;  removed 5 screws and one plate from right ankle  . ORIF ANKLE FRACTURE Right 09/23/2016   Procedure: OPEN REDUCTION INTERNAL FIXATION (ORIF) ANKLE FRACTURE;  Surgeon: Corky Mull, MD;  Location: [facility];  Service: Orthopedics;  Laterality: Right;  . 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 TONSILLECTOMY      Family History  Problem Relation Age of Onset  . Rectal cancer Mother   . Lymphoma Mother   . Lung cancer Father     Social History:  reports that she has been smoking cigarettes. She has a 53.75 pack-year smoking history. She has never used smokeless tobacco. She reports that she does not drink alcohol and does not use drugs.  She smokes 1 pack per day. She has been smoking since the age of 36 (75 years). She does not drink alcohol. Her mother had rectal cancer and lymphoma. Her father had lung cancer. She reports that her uncle had leukemia. She denies any expousres to radiation or toxins. She lives in [place], [place]. 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 The patient is alone today.  Allergies:  Allergies  Allergen Reactions  . Augmentin [Amoxicillin-Pot Clavulanate] Nausea Only    Current Medications: Current Outpatient Medications  Medication Sig Dispense Refill  . aspirin EC 81 MG tablet Take 81 mg by mouth daily.    . clonazePAM (KLONOPIN) 0.5 MG tablet TAKE 1/2 TO 1 TABLET BY MOUTH EVERY DAY ONLY AS NEEDED FOR ANXIETY  5  . ferrous sulfate 325 (65 FE) MG tablet Take by mouth.    . fluticasone (FLONASE) 50 MCG/ACT nasal spray Place into both nostrils daily as needed for allergies or rhinitis.    Marland Kitchen gabapentin (NEURONTIN) 300 MG capsule TITRATE SLOWLY  UP TO TAKING 1 CAPSULE BY MOUTH THREE TIMES A DAY    . meloxicam (MOBIC) 15 MG tablet Take 1 tablet (15 mg total) by mouth daily. 30 tablet 0  . oxybutynin (DITROPAN) 5 MG tablet Take 5 mg by mouth 2 (two) times daily.    . ranitidine (ZANTAC) 150 MG tablet Take 150 mg by mouth daily.    . simvastatin (ZOCOR) 10 MG tablet Take 10 mg by mouth at bedtime.     Marland Kitchen venlafaxine XR (EFFEXOR-XR) 75 MG 24 hr capsule Take 225 mg by mouth daily with breakfast.      No current facility-administered medications for this visit.    Review of Systems  Constitutional: Positive for malaise/fatigue (ongoing for several months). Negative for chills, diaphoresis, fever and weight loss (up 1 lb).  HENT: Negative for congestion, ear discharge, ear pain, hearing loss, nosebleeds, sinus pain, sore throat and tinnitus.        H/o throat polyps.  Eyes: Negative for blurred vision.  Respiratory: Negative for cough, hemoptysis, sputum production and shortness of breath.        No sleep apnea.  Cardiovascular: Negative for chest pain, palpitations and claudication.  Gastrointestinal: Negative for abdominal pain, blood in stool, constipation, diarrhea, heartburn, melena, nausea and vomiting.       Eating 1 meal a day. Drinking a lot of water.  Genitourinary: Negative for dysuria, frequency, hematuria and urgency.  Musculoskeletal: Negative for back pain, joint pain, myalgias and neck pain.  Skin: Negative for itching and rash.  Neurological: Negative for dizziness, tingling, sensory change, weakness and headaches.       Poor balance "due to age".  Endo/Heme/Allergies: Negative for environmental allergies. Does not bruise/bleed easily.  Psychiatric/Behavioral: Positive for depression (mood is unchaned). Negative for hallucinations. The patient is nervous/anxious (on klonopin). The patient does not have insomnia.   All other systems reviewed and are negative.  Performance status (ECOG): 1  Vitals Blood pressure (!)  155/77, pulse 78, temperature 97.6 F (36.4 C), temperature source Tympanic, weight 133 lb 6.1 oz (60.5 kg), SpO2 100 %.   Physical Exam Vitals and nursing note reviewed.  Constitutional:      General: She is not in acute distress.    Appearance: Normal appearance.     Interventions: Face mask in place.  HENT:     Head:     Comments: Short styled blonde and brown hair. Eyes:     Comments: Thick dark brown rimmed glasses.  Neurological:     Mental Status: She is alert and oriented to person, place, and time. Mental status is at baseline.  Psychiatric:        Mood and Affect: Mood normal.        Behavior: Behavior normal.        Thought Content: Thought content normal.        Judgment:  Judgment normal.    No visits with results within 3 Day(s) from this visit.  Latest known visit with results is:  Appointment on 12/04/2019  Component Date Value Ref Range Status  . TSH 12/04/2019 0.513  0.350 - 4.500 uIU/mL Final   Comment: Performed by a 3rd Generation assay with a functional sensitivity of <=0.01 uIU/mL. Performed at [facility], [address] [place], [place] [address]   . Uric Acid, Serum 12/04/2019 2.9  2.5 - 7.1 mg/dL Final   Performed at [facility], [address], [place], [place] [address]  . Osmolality 12/04/2019 271* 275 - 295 mOsm/kg Final   Performed at [facility], [address] [place], [place] [address]    Assessment:  YZABELLE CALLES is a 59 y.o. female with reactive leukocytosis since 2016.  WBC has ranged between 13,500 - 25,400 without trend.  Differential is predominantly neutrophils.  Monocyte count has ranged between 1200 -1900.  She has not received oral steroids, but 2 months ago received a steroid injection in her left shoulder.  Work-up on 11/20/2019 revealed a hematocrit was 40.8, hemoglobin 14.0, platelets 450,000, WBC 13,800 (ANC 7,200).  Monocyte count was 1000.  Sodium was 130.  Normal studies included LDH,  sed rate, flow cytometry and BCR-ABL. JAK2 V617F, exon 12-15, CALR and MPL was negative on 12/04/2019.  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  Pathology revealed splenic laceration with capsular and subcapsular hematoma.  She has a 50+ pack year smoking history.  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  Colonoscopy was normal in 2020. She is due for a mammogram.  Patient received the Moderna COVID-19 vaccine in 06/2019 and 07/2019.    Symptomatically, she feels "fine".  Sodium is 131.  Plan: 1.   Review labs from 11/20/2019 and 12/04/2019. 2.   Leukocytosis  WBC 13,800 with an ANC of 7200 and ALC 5200 on 11/20/2019.  Leukocytosis was first noted after splenectomy.  WBC has been elevated without trend.  Monocyte count elevated.    Etiology is felt secondary to smoking.  Work-up reveals a normal BCR-ABL, flow cytometry, and JAK2 with reflex. 3.   Smoking history   She has a 50+ pack year smoking history.  She has been referred to the low-dose chest CT program.  Patient's insurance does not cover lung cancer screening and would be self-pay.  Burgess Estelle, RN has arranged for Shared Decision Making visit and possible lung cancer screening scan.   CXR on 12/04/2019 revealed no acute cardiopulmonary disease.  There was a probable tiny LLL calcified granuloma.  Await possible coverage of low-dose chest CT.  Encourage smoking cessation. 4.   Hyponatremia  Sodium 130-131.  Patient notes drinking lots of water.  Follow-up with primary care physician. 5.   Hoarse  Patient has been seen by Dr. Kathyrn Sheriff.  Allergy testing is upcoming. 6.   RTC in 2 months for MD assessment and labs (CBC with diff, CMP).  I discussed the assessment and treatment plan with the patient.  The patient was provided an opportunity to ask questions and all were answered.  The patient agreed with the plan and demonstrated an understanding of the  instructions.  The patient was advised to call back if the symptoms worsen or if the condition fails to improve as anticipated.    Lumina Gitto C. Mike Gip, MD, PhD    12/23/2019, 2:03 PM  I, Selena Batten, am  acting as Education administrator for [employer]. Mike Gip, MD, PhD.  I, Akio Hudnall C. Mike Gip, MD, have reviewed the above documentation for accuracy and completeness, and I agree with the above.

## 2019-12-23 ENCOUNTER — Encounter: Payer: Self-pay | Admitting: Hematology and Oncology

## 2019-12-23 ENCOUNTER — Inpatient Hospital Stay
Payer: No Typology Code available for payment source | Attending: Hematology and Oncology | Admitting: Hematology and Oncology

## 2019-12-23 ENCOUNTER — Other Ambulatory Visit: Payer: Self-pay

## 2019-12-23 VITALS — BP 155/77 | HR 78 | Temp 97.6°F | Wt 133.4 lb

## 2019-12-23 DIAGNOSIS — F17210 Nicotine dependence, cigarettes, uncomplicated: Secondary | ICD-10-CM | POA: Diagnosis not present

## 2019-12-23 DIAGNOSIS — Z807 Family history of other malignant neoplasms of lymphoid, hematopoietic and related tissues: Secondary | ICD-10-CM | POA: Insufficient documentation

## 2019-12-23 DIAGNOSIS — E7800 Pure hypercholesterolemia, unspecified: Secondary | ICD-10-CM | POA: Diagnosis not present

## 2019-12-23 DIAGNOSIS — D72829 Elevated white blood cell count, unspecified: Secondary | ICD-10-CM | POA: Diagnosis not present

## 2019-12-23 DIAGNOSIS — E785 Hyperlipidemia, unspecified: Secondary | ICD-10-CM | POA: Insufficient documentation

## 2019-12-23 DIAGNOSIS — Z800 Family history of malignant neoplasm of digestive organs: Secondary | ICD-10-CM | POA: Diagnosis not present

## 2019-12-23 DIAGNOSIS — Z801 Family history of malignant neoplasm of trachea, bronchus and lung: Secondary | ICD-10-CM | POA: Diagnosis not present

## 2019-12-23 DIAGNOSIS — E871 Hypo-osmolality and hyponatremia: Secondary | ICD-10-CM

## 2019-12-23 DIAGNOSIS — D72828 Other elevated white blood cell count: Secondary | ICD-10-CM | POA: Diagnosis present

## 2019-12-23 DIAGNOSIS — Z7982 Long term (current) use of aspirin: Secondary | ICD-10-CM | POA: Insufficient documentation

## 2019-12-23 DIAGNOSIS — Z791 Long term (current) use of non-steroidal anti-inflammatories (NSAID): Secondary | ICD-10-CM | POA: Insufficient documentation

## 2019-12-23 DIAGNOSIS — K219 Gastro-esophageal reflux disease without esophagitis: Secondary | ICD-10-CM | POA: Diagnosis not present

## 2019-12-23 DIAGNOSIS — Z90710 Acquired absence of both cervix and uterus: Secondary | ICD-10-CM | POA: Diagnosis not present

## 2019-12-23 DIAGNOSIS — Z79899 Other long term (current) drug therapy: Secondary | ICD-10-CM | POA: Insufficient documentation

## 2019-12-23 NOTE — Progress Notes (Signed)
No new changes noted today 

## 2020-02-07 ENCOUNTER — Telehealth: Payer: Self-pay | Admitting: Acute Care

## 2020-02-07 NOTE — Telephone Encounter (Signed)
LMTC x 1 for pt. Need to discuss lung cancer screening grant.

## 2020-02-12 NOTE — Telephone Encounter (Signed)
Pt returning call.  [phone]

## 2020-02-21 NOTE — Telephone Encounter (Signed)
Spoke with pt regarding lung cancer screening grant. Pt states that she had a recent cxr with her PCP and she doesn't want to participate in lung screening at this time. Nothing further needed.

## 2020-02-24 ENCOUNTER — Other Ambulatory Visit: Payer: No Typology Code available for payment source

## 2020-02-24 ENCOUNTER — Ambulatory Visit: Payer: No Typology Code available for payment source | Admitting: Hematology and Oncology

## 2020-08-03 ENCOUNTER — Other Ambulatory Visit: Payer: Self-pay | Admitting: Physical Medicine and Rehabilitation

## 2020-08-03 DIAGNOSIS — M5416 Radiculopathy, lumbar region: Secondary | ICD-10-CM

## 2020-08-03 DIAGNOSIS — M5442 Lumbago with sciatica, left side: Secondary | ICD-10-CM

## 2020-08-17 ENCOUNTER — Other Ambulatory Visit: Payer: No Typology Code available for payment source

## 2020-10-19 ENCOUNTER — Inpatient Hospital Stay: Admission: RE | Admit: 2020-10-19 | Payer: No Typology Code available for payment source | Source: Ambulatory Visit

## 2020-10-28 ENCOUNTER — Other Ambulatory Visit: Payer: Self-pay

## 2020-10-28 ENCOUNTER — Ambulatory Visit
Admission: RE | Admit: 2020-10-28 | Discharge: 2020-10-28 | Disposition: A | Discharge status: Home / Self Care | Payer: No Typology Code available for payment source | Source: Ambulatory Visit | Attending: Physical Medicine and Rehabilitation | Admitting: Physical Medicine and Rehabilitation

## 2020-10-28 DIAGNOSIS — M5442 Lumbago with sciatica, left side: Secondary | ICD-10-CM

## 2020-10-28 DIAGNOSIS — M5416 Radiculopathy, lumbar region: Secondary | ICD-10-CM

## 2021-04-15 ENCOUNTER — Other Ambulatory Visit: Payer: Self-pay | Admitting: Family Medicine

## 2021-04-15 DIAGNOSIS — Z1231 Encounter for screening mammogram for malignant neoplasm of breast: Secondary | ICD-10-CM

## 2022-11-18 ENCOUNTER — Ambulatory Visit (INDEPENDENT_AMBULATORY_CARE_PROVIDER_SITE_OTHER): Payer: 59

## 2022-11-18 ENCOUNTER — Ambulatory Visit
Admission: EM | Admit: 2022-11-18 | Discharge: 2022-11-18 | Disposition: A | Discharge status: Home / Self Care | Payer: 59

## 2022-11-18 DIAGNOSIS — M25571 Pain in right ankle and joints of right foot: Secondary | ICD-10-CM

## 2022-11-18 MED ORDER — DICLOFENAC SODIUM 1 % EX GEL: 2.0000 g | Freq: Four times a day (QID) | CUTANEOUS | 0 refills | Status: AC

## 2022-11-18 NOTE — ED Triage Notes (Signed)
Pt c/o ankle swelling on right leg x2days  Pt has a plate placed in her ankle 5 years ago.   Pt is worried about cellulitis

## 2022-11-18 NOTE — Discharge Instructions (Signed)
Your x-rays did not show any signs of hardware failure or inflammatory changes concerning for infection or rejection.  Use the topical Voltaren gel 4 times a day to help with pain and inflammation.  Make a follow-up appointment with [facility] clinic orthopedics for reevaluation should the pain and swelling continue.

## 2022-11-18 NOTE — ED Provider Notes (Signed)
[facility]    CSN: [id number] Arrival date & time: 11/18/22  0940      History   Chief Complaint Chief Complaint  Patient presents with   Joint Swelling    HPI Kelli Marks is a 62 y.o. female.   HPI  15 old female with a past medical history significant for hyperlipidemia, hypercholesterolemia, GERD, and arthritis presenting for evaluation of 2 days worth of swelling to the outside of her right ankle.  She has had previous ORIF in 2018 which resulted in an infected hardware on the medial aspect that had to be removed.  She still is hard on the lateral aspect.  She states that at night she feels like there is needles poking in the outside of her right ankle.  She is not had any new injuries and she denies numbness or tingling in her toes.  There is no redness to the site.  Past Medical History:  Diagnosis Date   Arthritis    GERD (gastroesophageal reflux disease)    Hypercholesteremia    Hyperlipidemia    PONV (postoperative nausea and vomiting)    Rib fractures    Wears dentures    full upper and lower    Patient Active Problem List   Diagnosis Date Noted   Leukocytosis 11/20/2019   Cellulitis of right lower extremity 10/24/2016   Closed displaced pilon fracture of right tibia 09/24/2016   Closed fracture of distal end of right fibula 09/24/2016   Closed right ankle fracture 09/22/2016   Cigarette smoker 11/27/2014   Clinical depression 11/27/2014   Acid reflux 11/27/2014   Cannot sleep 11/27/2014   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   Acute respiratory failure (HCC) 10/31/2014   Splenic laceration 10/31/2014   Anemia 10/31/2014   Hyponatremia 10/31/2014   Essential hypertriglyceridemia 02/26/2014   Anxiety, generalized 04/10/2013    Past Surgical History:  Procedure Laterality Date   ABDOMINAL HYSTERECTOMY     ANKLE SURGERY Left    CHOLECYSTECTOMY     HARDWARE REMOVAL Right 01/18/2017   Procedure: HARDWARE REMOVAL right tibia tibral  plate and screws;  Surgeon: Christena Flake, MD;  Location: [facility];  Service: Orthopedics;  Laterality: Right;  removed 5 screws and one plate from right ankle   ORIF ANKLE FRACTURE Right 09/23/2016   Procedure: OPEN REDUCTION INTERNAL FIXATION (ORIF) ANKLE FRACTURE;  Surgeon: Christena Flake, MD;  Location: [facility];  Service: Orthopedics;  Laterality: Right;   SPLENECTOMY, TOTAL N/A 11/02/2014   Procedure: SPLENECTOMY;  Surgeon: Natale Lay, MD;  Location: [facility];  Service: General;  Laterality: N/A;   TONSILLECTOMY      OB History   No obstetric history on file.      Home Medications    Prior to Admission medications   Medication Sig Start Date End Date Taking? Authorizing Provider  clonazePAM (KLONOPIN) 0.5 MG tablet TAKE 1/2 TO 1 TABLET BY MOUTH EVERY DAY ONLY AS NEEDED FOR ANXIETY 08/22/14  Yes [provider]  diclofenac Sodium (VOLTAREN) 1 % GEL Apply 2 g topically 4 (four) times daily. 11/18/22  Yes Becky Augusta, NP  ferrous sulfate 325 (65 FE) MG tablet Take by mouth. 11/09/14  Yes [provider]  fluticasone (FLONASE) 50 MCG/ACT nasal spray Place into both nostrils daily as needed for allergies or rhinitis.   Yes [provider]  meloxicam (MOBIC) 15 MG tablet Take 1 tablet (15 mg total) by mouth daily. 01/26/19  Yes Verlee Monte, NP  oxybutynin (DITROPAN) 5 MG tablet Take 5 mg by mouth 2 (two) times daily.   Yes [provider]  ranitidine (ZANTAC) 150 MG tablet Take 150 mg by mouth daily.   Yes [provider]  simvastatin (ZOCOR) 10 MG tablet Take 10 mg by mouth at bedtime.    Yes [provider]  aspirin EC 81 MG tablet Take 81 mg by mouth daily.    [provider]  gabapentin (NEURONTIN) 300 MG capsule TITRATE SLOWLY UP TO TAKING 1 CAPSULE BY MOUTH THREE TIMES A DAY 06/25/18   [provider]  sertraline (ZOLOFT) 25 MG tablet Take 25 mg by mouth daily.    [provider]  venlafaxine XR  (EFFEXOR-XR) 75 MG 24 hr capsule Take 225 mg by mouth daily with breakfast.  11/18/14 12/23/19  [provider]    Family History Family History  Problem Relation Age of Onset   Rectal cancer Mother    Lymphoma Mother    Lung cancer Father     Social History Social History   Tobacco Use   Smoking status: Every Day    Packs/day: 1.25    Years: 43.00    Additional pack years: 0.00    Total pack years: 53.75    Types: Cigarettes   Smokeless tobacco: Never  Vaping Use   Vaping Use: Never used  Substance Use Topics   Alcohol use: No   Drug use: No     Allergies   Augmentin [amoxicillin-pot clavulanate]   Review of Systems Review of Systems  Musculoskeletal:  Positive for arthralgias and joint swelling.  Skin:  Negative for color change.  Neurological:  Negative for numbness.     Physical Exam Triage Vital Signs ED Triage Vitals  Enc Vitals Group     BP 11/18/22 1058 (!) 162/79     Pulse Rate 11/18/22 1058 77     Resp --      Temp 11/18/22 1058 97.9 F (36.6 C)     Temp Source 11/18/22 1058 Oral     SpO2 11/18/22 1058 95 %     Weight 11/18/22 1057 130 lb (59 kg)     Height 11/18/22 1057 5\' 7"  (1.702 m)     Head Circumference --      Peak Flow --      Pain Score 11/18/22 1057 9     Pain Loc --      Pain Edu? --      Excl. in GC? --    No data found.  Updated Vital Signs BP (!) 162/79 (BP Location: Left Arm)   Pulse 77   Temp 97.9 F (36.6 C) (Oral)   Ht 5\' 7"  (1.702 m)   Wt 130 lb (59 kg)   SpO2 95%   BMI 20.36 kg/m   Visual Acuity Right Eye Distance:   Left Eye Distance:   Bilateral Distance:    Right Eye Near:   Left Eye Near:    Bilateral Near:     Physical Exam Vitals and nursing note reviewed.  Constitutional:      Appearance: Normal appearance. She is not ill-appearing.  HENT:     Head: Normocephalic and atraumatic.  Musculoskeletal:        General: Swelling and tenderness present. No deformity or signs of injury.   Skin:    General: Skin is warm and dry.     Capillary Refill: Capillary refill takes less than 2 seconds.     Findings: No bruising  or erythema.  Neurological:     General: No focal deficit present.     Mental Status: She is alert and oriented to person, place, and time.      UC Treatments / Results  Labs (all labs ordered are listed, but only abnormal results are displayed) Labs Reviewed - No data to display  EKG   Radiology DG Ankle Complete Right  Result Date: 11/18/2022 CLINICAL DATA:  Pain and swelling EXAM: RIGHT ANKLE - COMPLETE 3+ VIEW COMPARISON:  Previous studies including the examination of 09/22/2016 FINDINGS: No recent fracture or dislocation is seen. There is previous internal fixation of fracture of distal shaft of fibula with metallic plate and surgical screws. Small smooth marginated calcifications adjacent to the tips of lateral and medial malleoli may suggest old avulsion fractures. There is small linear metallic foreign body in the soft tissues in the medial aspect of distal draw tarsals which has not changed. IMPRESSION: No recent fracture or dislocation is seen. Previous internal fixation in distal fibula. Small smoothly marginated calcifications adjacent to the tips of lateral and medial malleoli may suggest old avulsion fractures. Electronically Signed   By: Ernie Avena M.D.   On: 11/18/2022 12:26    Procedures Procedures (including critical care time)  Medications Ordered in UC Medications - No data to display  Initial Impression / Assessment and Plan / UC Course  I have reviewed the triage vital signs and the nursing notes.  Pertinent labs & imaging results that were available during my care of the patient were reviewed by me and considered in my medical decision making (see chart for details).   Patient is a nontoxic-appearing 48 old female presenting for evaluation of swelling and pain to her right lateral ankle that is been going on for the  past 2 days as outlined HPI above.  There is mild edema inferior and posterior to the lateral malleolus.  This is also where the patient is complaining of tenderness.  The joint is not hot or erythematous.  I will obtain a radiograph of the right ankle to evaluate the hardware and to look for signs of arthritis or other inflammatory changes.  Radiology impression of right ankle x-rays states that there is no recent fracture dislocation seen.  Previous internal fixation of the distal fibula.  Small smoothly marginated calcifications adjacent to the tip of the lateral medial malleolus I may suggest old avulsion fractures.  I will discharge patient home with a diagnosis of right ankle pain and have her treat inflammation with topical diclofenac 1% gel 4 times a day.  Her surgery was performed by [facility] clinic orthoped[facility] and I will have her make an appointment with them for follow-up if her swelling continues.  Final Clinical Impressions(s) / UC Diagnoses   Final diagnoses:  Acute right ankle pain     Discharge Instructions      Your x-rays did not show any signs of hardware failure or inflammatory changes concerning for infection or rejection.  Use the topical Voltaren gel 4 times a day to help with pain and inflammation.  Make a follow-up appointment with [facility] clinic orthopedics for reevaluation should the pain and swelling continue.     ED Prescriptions     Medication Sig Dispense Auth. Provider   diclofenac Sodium (VOLTAREN) 1 % GEL Apply 2 g topically 4 (four) times daily. 100 g Becky Augusta, NP      PDMP not reviewed this encounter.   Becky Augusta, NP 11/18/22 1238

## 2023-06-02 ENCOUNTER — Encounter: Payer: Self-pay | Admitting: Gastroenterology

## 2023-06-06 ENCOUNTER — Encounter: Payer: Self-pay | Admitting: Gastroenterology

## 2023-06-23 ENCOUNTER — Ambulatory Visit: Admit: 2023-06-23 | Payer: 59 | Admitting: Gastroenterology

## 2023-06-23 HISTORY — DX: Anxiety disorder, unspecified: F41.9

## 2023-06-23 HISTORY — DX: Asplenia (congenital): Q89.01

## 2023-06-23 HISTORY — DX: Other intervertebral disc degeneration, lumbar region without mention of lumbar back pain or lower extremity pain: M51.369

## 2023-06-23 HISTORY — DX: Insomnia, unspecified: G47.00

## 2023-06-23 HISTORY — DX: Depression, unspecified: F32.A

## 2023-06-23 HISTORY — DX: Cerebral infarction, unspecified: I63.9

## 2023-06-23 HISTORY — DX: Overactive bladder: N32.81

## 2023-06-23 SURGERY — COLONOSCOPY WITH PROPOFOL
Anesthesia: General

## 2023-08-18 ENCOUNTER — Encounter: Payer: Self-pay | Admitting: Gastroenterology

## 2023-08-28 ENCOUNTER — Ambulatory Visit
Admission: RE | Admit: 2023-08-28 | Discharge: 2023-08-28 | Disposition: A | Discharge status: Home / Self Care | Payer: 59 | Attending: Gastroenterology | Admitting: Gastroenterology

## 2023-08-28 ENCOUNTER — Other Ambulatory Visit: Payer: Self-pay

## 2023-08-28 ENCOUNTER — Encounter: Payer: Self-pay | Admitting: Gastroenterology

## 2023-08-28 ENCOUNTER — Ambulatory Visit: Payer: Self-pay | Admitting: Anesthesiology

## 2023-08-28 ENCOUNTER — Encounter
Admission: RE | Disposition: A | Discharge status: Home / Self Care | Payer: Self-pay | Source: Home / Self Care | Attending: Gastroenterology

## 2023-08-28 DIAGNOSIS — D124 Benign neoplasm of descending colon: Secondary | ICD-10-CM | POA: Diagnosis not present

## 2023-08-28 DIAGNOSIS — F419 Anxiety disorder, unspecified: Secondary | ICD-10-CM | POA: Diagnosis not present

## 2023-08-28 DIAGNOSIS — F17200 Nicotine dependence, unspecified, uncomplicated: Secondary | ICD-10-CM | POA: Insufficient documentation

## 2023-08-28 DIAGNOSIS — Z1211 Encounter for screening for malignant neoplasm of colon: Secondary | ICD-10-CM | POA: Insufficient documentation

## 2023-08-28 DIAGNOSIS — D128 Benign neoplasm of rectum: Secondary | ICD-10-CM | POA: Insufficient documentation

## 2023-08-28 DIAGNOSIS — Z7982 Long term (current) use of aspirin: Secondary | ICD-10-CM | POA: Diagnosis not present

## 2023-08-28 DIAGNOSIS — Z791 Long term (current) use of non-steroidal anti-inflammatories (NSAID): Secondary | ICD-10-CM | POA: Insufficient documentation

## 2023-08-28 DIAGNOSIS — Z800 Family history of malignant neoplasm of digestive organs: Secondary | ICD-10-CM | POA: Insufficient documentation

## 2023-08-28 DIAGNOSIS — Z8673 Personal history of transient ischemic attack (TIA), and cerebral infarction without residual deficits: Secondary | ICD-10-CM | POA: Insufficient documentation

## 2023-08-28 DIAGNOSIS — Z79899 Other long term (current) drug therapy: Secondary | ICD-10-CM | POA: Diagnosis not present

## 2023-08-28 HISTORY — PX: POLYPECTOMY: SHX5525

## 2023-08-28 HISTORY — PX: COLONOSCOPY WITH PROPOFOL: SHX5780

## 2023-08-28 SURGERY — COLONOSCOPY WITH PROPOFOL
Anesthesia: General

## 2023-08-28 MED ORDER — PROPOFOL 10 MG/ML IV BOLUS
INTRAVENOUS | Status: AC
  Filled 2023-08-28: qty 40

## 2023-08-28 MED ORDER — FENTANYL CITRATE (PF) 100 MCG/2ML IJ SOLN
INTRAMUSCULAR | Status: AC
Start: 2023-08-28 — End: ?
  Filled 2023-08-28: qty 2

## 2023-08-28 MED ORDER — LIDOCAINE HCL (PF) 2 % IJ SOLN
INTRAMUSCULAR | Status: AC
Start: 2023-08-28 — End: ?
  Filled 2023-08-28: qty 5

## 2023-08-28 MED ORDER — SIMETHICONE 40 MG/0.6ML PO SUSP
ORAL | Status: DC | PRN
  Administered 2023-08-28: 120 mL via ORAL

## 2023-08-28 MED ORDER — PROPOFOL 10 MG/ML IV BOLUS
INTRAVENOUS | Status: DC | PRN
  Administered 2023-08-28: 50 mg via INTRAVENOUS
  Administered 2023-08-28: 125 ug/kg/min via INTRAVENOUS
  Administered 2023-08-28: 50 mg via INTRAVENOUS

## 2023-08-28 MED ORDER — LIDOCAINE HCL (CARDIAC) PF 100 MG/5ML IV SOSY
PREFILLED_SYRINGE | INTRAVENOUS | Status: DC | PRN
  Administered 2023-08-28: 60 mg via INTRAVENOUS

## 2023-08-28 MED ORDER — SODIUM CHLORIDE 0.9 % IV SOLN: INTRAVENOUS | Status: DC

## 2023-08-28 MED ORDER — ONDANSETRON HCL 4 MG/2ML IJ SOLN
INTRAMUSCULAR | Status: DC | PRN
  Administered 2023-08-28: 4 mg via INTRAVENOUS

## 2023-08-28 MED ORDER — FENTANYL CITRATE (PF) 100 MCG/2ML IJ SOLN
INTRAMUSCULAR | Status: DC | PRN
  Administered 2023-08-28: 25 ug via INTRAVENOUS

## 2023-08-28 NOTE — Transfer of Care (Signed)
 Immediate Anesthesia Transfer of Care Note  Patient: Kelli Marks  Procedure(s) Performed: COLONOSCOPY WITH PROPOFOL POLYPECTOMY  Patient Location: PACU  Anesthesia Type:MAC  Level of Consciousness: awake, alert , and oriented  Airway & Oxygen Therapy: Patient Spontanous Breathing  Post-op Assessment: Report given to RN and Post -op Vital signs reviewed and stable  Post vital signs: stable  Last Vitals:  Vitals Value Taken Time  BP 125/69 08/28/23 1100  Temp 36.3 C 08/28/23 1100  Pulse 82 08/28/23 1100  Resp 15 08/28/23 1100  SpO2 100 % 08/28/23 1100  Vitals shown include unfiled device data.  Last Pain:  Vitals:   08/28/23 1100  TempSrc: Temporal  PainSc: 0-No pain         Complications: No notable events documented.

## 2023-08-28 NOTE — Interval H&P Note (Signed)
 History and Physical Interval Note: Preprocedure H&P from 08/28/23  was reviewed and there was no interval change after seeing and examining the patient.  Written consent was obtained from the patient after discussion of risks, benefits, and alternatives. Patient has consented to proceed with Colonoscopy with possible intervention   08/28/2023 10:29 AM  Kelli Marks  has presented today for surgery, with the diagnosis of Z86.0101 (ICD-10-CM) - Hx of adenomatous colonic polyps Z80.0 (ICD-10-CM) - FH: colon cancer.  The various methods of treatment have been discussed with the patient and family. After consideration of risks, benefits and other options for treatment, the patient has consented to  Procedure(s): COLONOSCOPY WITH PROPOFOL (N/A) as a surgical intervention.  The patient's history has been reviewed, patient examined, no change in status, stable for surgery.  I have reviewed the patient's chart and labs.  Questions were answered to the patient's satisfaction.     Jaynie Collins

## 2023-08-28 NOTE — Op Note (Signed)
 [facility] Gastroenterology Patient Name: Kelli Marks Procedure Date: 08/28/2023 10:09 AM MRN: [id number] Account #: [id number] Date of Birth: [birth year] Admit Type: Outpatient Age: 63 Room: [facility] ENDO ROOM 2 Gender: Female Note Status: Finalized Instrument Name: Peds Colonoscope [id number] Procedure:             Colonoscopy Indications:           High risk colon cancer surveillance: Personal history                         of colonic polyps, Family history of colon cancer in a                         first-degree relative before age 52 years Providers:             Trenda Moots, DO Referring MD:          [facility] Medicines:             Monitored Anesthesia Care Complications:         No immediate complications. Estimated blood loss:                         Minimal. Procedure:             Pre-Anesthesia Assessment:                        - Prior to the procedure, a History and Physical was                         performed, and patient medications and allergies were                         reviewed. The patient is competent. The risks and                         benefits of the procedure and the sedation options and                         risks were discussed with the patient. All questions                         were answered and informed consent was obtained.                         Patient identification and proposed procedure were                         verified by the physician, the nurse, the anesthetist                         and the technician in the endoscopy suite. Mental                         Status Examination: alert and oriented. Airway                         Examination: normal oropharyngeal airway and neck  mobility. Respiratory Examination: clear to                         auscultation. CV Examination: RRR, no murmurs, no S3                         or S4. Prophylactic Antibiotics: The patient does not                          require prophylactic antibiotics. Prior                         Anticoagulants: The patient has taken no anticoagulant                         or antiplatelet agents. ASA Grade Assessment: III - A                         patient with severe systemic disease. After reviewing                         the risks and benefits, the patient was deemed in                         satisfactory condition to undergo the procedure. The                         anesthesia plan was to use monitored anesthesia care                         (MAC). Immediately prior to administration of                         medications, the patient was re-assessed for adequacy                         to receive sedatives. The heart rate, respiratory                         rate, oxygen saturations, blood pressure, adequacy of                         pulmonary ventilation, and response to care were                         monitored throughout the procedure. The physical                         status of the patient was re-assessed after the                         procedure.                        After obtaining informed consent, the colonoscope was                         passed under direct vision. Throughout the procedure,  the patient's blood pressure, pulse, and oxygen                         saturations were monitored continuously. The                         Colonoscope was introduced through the anus and                         advanced to the the terminal ileum, with                         identification of the appendiceal orifice and IC                         valve. The colonoscopy was performed without                         difficulty. The patient tolerated the procedure well.                         The quality of the bowel preparation was inadequate.                         The terminal ileum, ileocecal valve, appendiceal                         orifice, and rectum  were photographed. Findings:      The perianal and digital rectal examinations were normal. Pertinent       negatives include normal sphincter tone.      The terminal ileum appeared normal. Estimated blood loss: none.      Two sessile polyps were found in the rectum and descending colon. The       polyps were 3 to 4 mm in size. These polyps were removed with a cold       snare. Resection and retrieval were complete. Estimated blood loss was       minimal.      A large amount of stool was found in the entire colon, interfering with       visualization. Lavage of the area was performed using a moderate amount,       resulting in incomplete clearance with fair visualization. Estimated       blood loss: none.      Retroflexion in the right colon was performed.      The exam was otherwise without abnormality on direct and retroflexion       views. Impression:            - Preparation of the colon was inadequate.                        - The examined portion of the ileum was normal.                        - Two 3 to 4 mm polyps in the rectum and in the                         descending colon, removed with a cold snare. Resected  and retrieved.                        - Stool in the entire examined colon.                        - The examination was otherwise normal on direct and                         retroflexion views. Recommendation:        - Patient has a contact number available for                         emergencies. The signs and symptoms of potential                         delayed complications were discussed with the patient.                         Return to normal activities tomorrow. Written                         discharge instructions were provided to the patient.                        - Discharge patient to home.                        - Resume previous diet.                        - Continue present medications.                        - Await  pathology results.                        - No ibuprofen, naproxen, or other non-steroidal                         anti-inflammatory drugs for 5 days after polyp removal.                        - Repeat colonoscopy 6-12 months because the bowel                         preparation was poor.                        - Return to referring physician as previously                         scheduled.                        - The findings and recommendations were discussed with                         the patient. Procedure Code(s):     --- Professional ---  95621, Colonoscopy, flexible; with removal of                         tumor(s), polyp(s), or other lesion(s) by snare                         technique Diagnosis Code(s):     --- Professional ---                        Z86.010, Personal history of colonic polyps                        D12.8, Benign neoplasm of rectum                        D12.4, Benign neoplasm of descending colon                        Z80.0, Family history of malignant neoplasm of                         digestive organs CPT copyright 2022 American Medical Association. All rights reserved. The codes documented in this report are preliminary and upon coder review may  be revised to meet current compliance requirements. Attending Participation:      I personally performed the entire procedure. Elfredia Nevins, DO Jaynie Collins DO, DO 08/28/2023 10:58:47 AM This report has been signed electronically. Number of Addenda: 0 Note Initiated On: 08/28/2023 10:09 AM Scope Withdrawal Time: 0 hours 9 minutes 2 seconds  Total Procedure Duration: 0 hours 15 minutes 15 seconds  Estimated Blood Loss:  Estimated blood loss was minimal.      [facility]

## 2023-08-28 NOTE — H&P (Signed)
 Pre-Procedure H&P   Patient ID: Kelli Marks is a 63 y.o. female.  Gastroenterology Provider: Jaynie Collins, DO  Referring Provider: Fransico Setters, NP PCP: [facility]  Date: 08/28/2023  HPI Ms. Kelli Marks is a 63 y.o. female who presents today for Colonoscopy for personal history of colon polyps; family history of colon cancer .  Patient's mother with colon cancer Patient's last colonoscopy December 2019 with 1 sessile serrated polyp, 1 tubular adenoma.  2014 colonoscopy with 1 tubular adenoma  She reports regular bowel movements without melena or hematochezia  Ferritin 104 iron sat 44 B12 357 hemoglobin 13.6 MCV 99 platelets 416,000 creatinine 0.8  Status post hysterectomy and cholecystectomy    Past Medical History:  Diagnosis Date   Anxiety    Arthritis    Asplenia    DDD (degenerative disc disease), lumbar    Depression    GERD (gastroesophageal reflux disease)    Hypercholesteremia    Hyperlipidemia    Insomnia    Overactive bladder    PONV (postoperative nausea and vomiting)    Rib fractures    Stroke [facility])    Wears dentures    full upper and lower    Past Surgical History:  Procedure Laterality Date   ABDOMINAL HYSTERECTOMY     ANKLE SURGERY Left    CHOLECYSTECTOMY     COLONOSCOPY     x4   HARDWARE REMOVAL Right 01/18/2017   Procedure: HARDWARE REMOVAL right tibia tibral plate and screws;  Surgeon: Christena Flake, MD;  Location[facility];  Service: Orthopedics;  Laterality: Right;  removed 5 screws and one plate from right ankle   hardware removal right distal tibia Right    ORIF ANKLE FRACTURE Right 09/23/2016   Procedure: OPEN REDUCTION INTERNAL FIXATION (ORIF) ANKLE FRACTURE;  Surgeon: Christena Flake, MD;  Location: [facility] ORS;  Service: Orthopedics;  Laterality: Right;   SPLENECTOMY, TOTAL N/A 11/02/2014   Procedure: SPLENECTOMY;  Surgeon: Natale Lay, MD;  Location: [facility] ORS;  Service: General;  Laterality: N/A;    TONSILLECTOMY      Family History Mother- crc No h/o GI disease or malignancy  Review of Systems  Constitutional:  Negative for activity change, appetite change, chills, diaphoresis, fatigue, fever and unexpected weight change.  HENT:  Negative for trouble swallowing and voice change.   Respiratory:  Negative for shortness of breath and wheezing.   Cardiovascular:  Negative for chest pain, palpitations and leg swelling.  Gastrointestinal:  Negative for abdominal distention, abdominal pain, anal bleeding, blood in stool, constipation, diarrhea, nausea, rectal pain and vomiting.  Musculoskeletal:  Negative for arthralgias and myalgias.  Skin:  Negative for color change and pallor.  Neurological:  Negative for dizziness, syncope and weakness.  Psychiatric/Behavioral:  Negative for confusion.   All other systems reviewed and are negative.    Medications No current facility-administered medications on file prior to encounter.   Current Outpatient Medications on File Prior to Encounter  Medication Sig Dispense Refill   clonazePAM (KLONOPIN) 0.5 MG tablet TAKE 1/2 TO 1 TABLET BY MOUTH EVERY DAY ONLY AS NEEDED FOR ANXIETY  5   ferrous sulfate 325 (65 FE) MG tablet Take by mouth.     oxybutynin (DITROPAN) 5 MG tablet Take 5 mg by mouth 2 (two) times daily.     ranitidine (ZANTAC) 150 MG tablet Take 150 mg by mouth daily.     sertraline (ZOLOFT) 25 MG tablet Take 25 mg by mouth daily.  simvastatin (ZOCOR) 10 MG tablet Take 10 mg by mouth at bedtime.      aspirin EC 81 MG tablet Take 81 mg by mouth daily.     diclofenac Sodium (VOLTAREN) 1 % GEL Apply 2 g topically 4 (four) times daily. 100 g 0   fluticasone (FLONASE) 50 MCG/ACT nasal spray Place into both nostrils daily as needed for allergies or rhinitis.     gabapentin (NEURONTIN) 300 MG capsule TITRATE SLOWLY UP TO TAKING 1 CAPSULE BY MOUTH THREE TIMES A DAY     venlafaxine XR (EFFEXOR-XR) 75 MG 24 hr capsule Take 225 mg by mouth daily  with breakfast.       Pertinent medications related to GI and procedure were reviewed by me with the patient prior to the procedure   Current Facility-Administered Medications:    0.9 %  sodium chloride infusion, , Intravenous, Continuous, Jaynie Collins, DO, Last Rate: 20 mL/hr at 08/28/23 0951, New Bag at 08/28/23 1610  Facility-Administered Medications Ordered in Other Encounters:    lidocaine (cardiac) 100 mg/48mL (XYLOCAINE) injection 2%, , Intravenous, Anesthesia Intra-op, Darrell Jewel I, CRNA, 60 mg at 08/28/23 1000   propofol (DIPRIVAN) 10 mg/mL bolus/IV push, , Intravenous, Anesthesia Intra-op, [place], Jeannette I, CRNA, 50 mg at 08/28/23 1000  sodium chloride 20 mL/hr at 08/28/23 9604       Allergies  Allergen Reactions   Augmentin [Amoxicillin-Pot Clavulanate] Nausea Only   Allergies were reviewed by me prior to the procedure  Objective   Body mass index is 19.92 kg/m. Vitals:   08/28/23 1017  BP: 123/79  Pulse: 79  Resp: 18  Temp: (!) 96.9 F (36.1 C)  TempSrc: Temporal  SpO2: 100%  Weight: 57.7 kg  Height: 5\' 7"  (1.702 m)     Physical Exam Vitals and nursing note reviewed.  Constitutional:      General: She is not in acute distress.    Appearance: Normal appearance. She is not ill-appearing, toxic-appearing or diaphoretic.  HENT:     Head: Normocephalic and atraumatic.     Nose: Nose normal.     Mouth/Throat:     Mouth: Mucous membranes are moist.     Pharynx: Oropharynx is clear.  Eyes:     General: No scleral icterus.    Extraocular Movements: Extraocular movements intact.  Cardiovascular:     Rate and Rhythm: Normal rate and regular rhythm.     Heart sounds: Normal heart sounds. No murmur heard.    No friction rub. No gallop.  Pulmonary:     Effort: Pulmonary effort is normal. No respiratory distress.     Breath sounds: No wheezing, rhonchi or rales.     Comments: Diminished bilaterally Abdominal:     General: Bowel sounds  are normal. There is no distension.     Palpations: Abdomen is soft.     Tenderness: There is no abdominal tenderness. There is no guarding or rebound.  Musculoskeletal:     Cervical back: Neck supple.     Right lower leg: No edema.     Left lower leg: No edema.  Skin:    General: Skin is warm and dry.     Coloration: Skin is not jaundiced or pale.  Neurological:     General: No focal deficit present.     Mental Status: She is alert and oriented to person, place, and time. Mental status is at baseline.  Psychiatric:        Mood and Affect: Mood normal.  Behavior: Behavior normal.        Thought Content: Thought content normal.        Judgment: Judgment normal.      Assessment:  Ms. Kelli Marks is a 63 y.o. female  who presents today for Colonoscopy for personal history of colon polyps and family history of colon cancer.  Plan:  Colonoscopy with possible intervention today  Colonoscopy with possible biopsy, control of bleeding, polypectomy, and interventions as necessary has been discussed with the patient/patient representative. Informed consent was obtained from the patient/patient representative after explaining the indication, nature, and risks of the procedure including but not limited to death, bleeding, perforation, missed neoplasm/lesions, cardiorespiratory compromise, and reaction to medications. Opportunity for questions was given and appropriate answers were provided. Patient/patient representative has verbalized understanding is amenable to undergoing the procedure.   Jaynie Collins, DO  [facility]  Portions of the record may have been created with voice recognition software. Occasional wrong-word or 'sound-a-like' substitutions may have occurred due to the inherent limitations of voice recognition software.  Read the chart carefully and recognize, using context, where substitutions may have occurred.

## 2023-08-28 NOTE — Anesthesia Preprocedure Evaluation (Signed)
 Anesthesia Evaluation  Patient identified by MRN, date of birth, ID band Patient awake    Reviewed: Allergy & Precautions, H&P , NPO status , Patient's Chart, lab work & pertinent test results  History of Anesthesia Complications (+) PONV and history of anesthetic complications  Airway Mallampati: II  TM Distance: >3 FB Neck ROM: full    Dental  (+) Lower Dentures, Upper Dentures   Pulmonary Current Smoker   Pulmonary exam normal        Cardiovascular negative cardio ROS Normal cardiovascular exam     Neuro/Psych  PSYCHIATRIC DISORDERS Anxiety     CVA    GI/Hepatic negative GI ROS, Neg liver ROS,,,  Endo/Other  negative endocrine ROS    Renal/GU negative Renal ROS  negative genitourinary   Musculoskeletal   Abdominal Normal abdominal exam  (+)   Peds  Hematology negative hematology ROS (+)   Anesthesia Other Findings Past Medical History: No date: Anxiety No date: Arthritis No date: Asplenia No date: DDD (degenerative disc disease), lumbar No date: Depression No date: GERD (gastroesophageal reflux disease) No date: Hypercholesteremia No date: Hyperlipidemia No date: Insomnia No date: Overactive bladder No date: PONV (postoperative nausea and vomiting) No date: Rib fractures No date: Stroke [facility]) No date: Wears dentures     Comment:  full upper and lower  Past Surgical History: No date: ABDOMINAL HYSTERECTOMY No date: ANKLE SURGERY; Left No date: CHOLECYSTECTOMY No date: COLONOSCOPY     Comment:  x4 01/18/2017: HARDWARE REMOVAL; Right     Comment:  Procedure: HARDWARE REMOVAL right tibia tibral plate and              screws;  Surgeon: Christena Flake, MD;  Location: [facility];  Service: Orthopedics;  Laterality: Right;               removed 5 screws and one plate from right ankle No date: hardware removal right distal tibia; Right 09/23/2016: ORIF ANKLE FRACTURE; Right      Comment:  Procedure: OPEN REDUCTION INTERNAL FIXATION (ORIF) ANKLE              FRACTURE;  Surgeon: Christena Flake, MD;  Location: [facility]               ORS;  Service: Orthopedics;  Laterality: Right; 11/02/2014: SPLENECTOMY, TOTAL; N/A     Comment:  Procedure: SPLENECTOMY;  Surgeon: Natale Lay, MD;                Location: [facility] ORS;  Service: General;  Laterality: N/A; No date: TONSILLECTOMY     Reproductive/Obstetrics negative OB ROS                              Anesthesia Physical Anesthesia Plan  ASA: 3  Anesthesia Plan: General   Post-op Pain Management:    Induction: Intravenous  PONV Risk Score and Plan: Propofol infusion, TIVA and Ondansetron  Airway Management Planned: Natural Airway  Additional Equipment:   Intra-op Plan:   Post-operative Plan:   Informed Consent: I have reviewed the patients History and Physical, chart, labs and discussed the procedure including the risks, benefits and alternatives for the proposed anesthesia with the patient or authorized representative who has indicated his/her understanding and acceptance.     Dental Advisory Given  Plan Discussed with: CRNA and Surgeon  Anesthesia Plan Comments:  Anesthesia Quick Evaluation

## 2023-08-28 NOTE — Interval H&P Note (Signed)
 History and Physical Interval Note: Preprocedure H&P from 08/28/23  was reviewed and there was no interval change after seeing and examining the patient.  Written consent was obtained from the patient after discussion of risks, benefits, and alternatives. Patient has consented to proceed with Colonoscopy with possible intervention   08/28/2023 10:29 AM  Kelli Marks  has presented today for surgery, with the diagnosis of Z86.0101 (ICD-10-CM) - Hx of adenomatous colonic polyps Z80.0 (ICD-10-CM) - FH: colon cancer.  The various methods of treatment have been discussed with the patient and family. After consideration of risks, benefits and other options for treatment, the patient has consented to  Procedure(s): COLONOSCOPY WITH PROPOFOL (N/A) as a surgical intervention.  The patient's history has been reviewed, patient examined, no change in status, stable for surgery.  I have reviewed the patient's chart and labs.  Questions were answered to the patient's satisfaction.     Kelli Marks

## 2023-08-29 LAB — SURGICAL PATHOLOGY

## 2023-08-29 NOTE — Anesthesia Postprocedure Evaluation (Signed)
 Anesthesia Post Note  Patient: Kelli Marks  Procedure(s) Performed: COLONOSCOPY WITH PROPOFOL POLYPECTOMY  Patient location during evaluation: PACU Anesthesia Type: General Level of consciousness: awake and alert Pain management: pain level controlled Vital Signs Assessment: post-procedure vital signs reviewed and stable Respiratory status: spontaneous breathing, nonlabored ventilation and respiratory function stable Cardiovascular status: blood pressure returned to baseline and stable Postop Assessment: no apparent nausea or vomiting Anesthetic complications: no   No notable events documented.   Last Vitals:  Vitals:   08/28/23 1017 08/28/23 1100  BP: 123/79 125/69  Pulse: 79 80  Resp: 18 15  Temp: (!) 36.1 C (!) 36.3 C  SpO2: 100% 99%    Last Pain:  Vitals:   08/28/23 1120  TempSrc:   PainSc: 0-No pain                 Foye Deer

## 2023-10-02 ENCOUNTER — Other Ambulatory Visit: Payer: Self-pay | Admitting: Family Medicine

## 2023-10-02 DIAGNOSIS — Z1231 Encounter for screening mammogram for malignant neoplasm of breast: Secondary | ICD-10-CM

## 2023-10-18 ENCOUNTER — Ambulatory Visit
Admission: RE | Admit: 2023-10-18 | Discharge: 2023-10-18 | Disposition: A | Discharge status: Home / Self Care | Source: Ambulatory Visit | Attending: Family Medicine | Admitting: Family Medicine

## 2023-10-18 DIAGNOSIS — Z1231 Encounter for screening mammogram for malignant neoplasm of breast: Secondary | ICD-10-CM | POA: Diagnosis present

## 2024-02-02 ENCOUNTER — Encounter: Payer: Self-pay | Admitting: Gastroenterology

## 2024-02-12 ENCOUNTER — Ambulatory Visit: Admitting: Anesthesiology

## 2024-02-12 ENCOUNTER — Ambulatory Visit
Admission: RE | Admit: 2024-02-12 | Discharge: 2024-02-12 | Disposition: A | Discharge status: Home / Self Care | Attending: Gastroenterology | Admitting: Gastroenterology

## 2024-02-12 ENCOUNTER — Encounter: Payer: Self-pay | Admitting: Gastroenterology

## 2024-02-12 ENCOUNTER — Encounter
Admission: RE | Disposition: A | Discharge status: Home / Self Care | Payer: Self-pay | Source: Home / Self Care | Attending: Gastroenterology

## 2024-02-12 DIAGNOSIS — K5730 Diverticulosis of large intestine without perforation or abscess without bleeding: Secondary | ICD-10-CM | POA: Insufficient documentation

## 2024-02-12 DIAGNOSIS — D122 Benign neoplasm of ascending colon: Secondary | ICD-10-CM | POA: Diagnosis not present

## 2024-02-12 DIAGNOSIS — K640 First degree hemorrhoids: Secondary | ICD-10-CM | POA: Diagnosis not present

## 2024-02-12 DIAGNOSIS — K635 Polyp of colon: Secondary | ICD-10-CM | POA: Diagnosis not present

## 2024-02-12 DIAGNOSIS — Z8673 Personal history of transient ischemic attack (TIA), and cerebral infarction without residual deficits: Secondary | ICD-10-CM | POA: Insufficient documentation

## 2024-02-12 DIAGNOSIS — D124 Benign neoplasm of descending colon: Secondary | ICD-10-CM | POA: Insufficient documentation

## 2024-02-12 DIAGNOSIS — F17200 Nicotine dependence, unspecified, uncomplicated: Secondary | ICD-10-CM | POA: Diagnosis not present

## 2024-02-12 DIAGNOSIS — F419 Anxiety disorder, unspecified: Secondary | ICD-10-CM | POA: Diagnosis not present

## 2024-02-12 DIAGNOSIS — Z800 Family history of malignant neoplasm of digestive organs: Secondary | ICD-10-CM | POA: Insufficient documentation

## 2024-02-12 DIAGNOSIS — Z860101 Personal history of adenomatous and serrated colon polyps: Secondary | ICD-10-CM | POA: Diagnosis present

## 2024-02-12 DIAGNOSIS — Z1211 Encounter for screening for malignant neoplasm of colon: Secondary | ICD-10-CM | POA: Insufficient documentation

## 2024-02-12 DIAGNOSIS — Z9049 Acquired absence of other specified parts of digestive tract: Secondary | ICD-10-CM | POA: Diagnosis not present

## 2024-02-12 DIAGNOSIS — Z90710 Acquired absence of both cervix and uterus: Secondary | ICD-10-CM | POA: Insufficient documentation

## 2024-02-12 HISTORY — PX: COLONOSCOPY: SHX5424

## 2024-02-12 HISTORY — PX: POLYPECTOMY: SHX149

## 2024-02-12 SURGERY — COLONOSCOPY
Anesthesia: General

## 2024-02-12 MED ORDER — SODIUM CHLORIDE 0.9 % IV SOLN
INTRAVENOUS | Status: DC
  Administered 2024-02-12: 20 mL/h via INTRAVENOUS

## 2024-02-12 MED ORDER — PROPOFOL 500 MG/50ML IV EMUL
INTRAVENOUS | Status: DC | PRN
  Administered 2024-02-12: 140 ug/kg/min via INTRAVENOUS

## 2024-02-12 MED ORDER — PROPOFOL 10 MG/ML IV BOLUS
INTRAVENOUS | Status: DC | PRN
  Administered 2024-02-12: 80 mg via INTRAVENOUS

## 2024-02-12 NOTE — Op Note (Signed)
 [facility] Gastroenterology Patient Name: Kelli Marks Procedure Date: 02/12/2024 7:20 AM MRN: [id number] Account #: [id number] Date of Birth: [birth year] Admit Type: Outpatient Age: 63 Room: [facility] ENDO ROOM 2 Gender: Female Note Status: Finalized Instrument Name: Colon Scope [id number] Procedure:             Colonoscopy Indications:           High risk colon cancer surveillance: Personal history                         of colonic polyps Providers:             Elspeth Ozell Jungling DO, DO Referring MD:          No Local Md, MD (Referring MD) Medicines:             Monitored Anesthesia Care Complications:         No immediate complications. Estimated blood loss:                         Minimal. Procedure:             Pre-Anesthesia Assessment:                        - Prior to the procedure, a History and Physical was                         performed, and patient medications and allergies were                         reviewed. The patient is competent. The risks and                         benefits of the procedure and the sedation options and                         risks were discussed with the patient. All questions                         were answered and informed consent was obtained.                         Patient identification and proposed procedure were                         verified by the physician, the nurse, the anesthetist                         and the technician in the endoscopy suite. Mental                         Status Examination: alert and oriented. Airway                         Examination: normal oropharyngeal airway and neck                         mobility. Respiratory Examination: clear to  auscultation. CV Examination: RRR, no murmurs, no S3                         or S4. Prophylactic Antibiotics: The patient does not                         require prophylactic antibiotics. Prior                          Anticoagulants: The patient has taken no anticoagulant                         or antiplatelet agents. ASA Grade Assessment: III - A                         patient with severe systemic disease. After reviewing                         the risks and benefits, the patient was deemed in                         satisfactory condition to undergo the procedure. The                         anesthesia plan was to use monitored anesthesia care                         (MAC). Immediately prior to administration of                         medications, the patient was re-assessed for adequacy                         to receive sedatives. The heart rate, respiratory                         rate, oxygen saturations, blood pressure, adequacy of                         pulmonary ventilation, and response to care were                         monitored throughout the procedure. The physical                         status of the patient was re-assessed after the                         procedure.                        After obtaining informed consent, the colonoscope was                         passed under direct vision. Throughout the procedure,                         the patient's blood pressure, pulse, and oxygen  saturations were monitored continuously. The                         Colonoscope was introduced through the anus and                         advanced to the the cecum, identified by appendiceal                         orifice and ileocecal valve. The colonoscopy was                         performed without difficulty. The patient tolerated                         the procedure well. The quality of the bowel                         preparation was evaluated using the BBPS [facility] Bowel                         Preparation Scale) with scores of: Right Colon = 2                         (minor amount of residual staining, small fragments of                         stool  and/or opaque liquid, but mucosa seen well),                         Transverse Colon = 2 (minor amount of residual                         staining, small fragments of stool and/or opaque                         liquid, but mucosa seen well) and Left Colon = 2                         (minor amount of residual staining, small fragments of                         stool and/or opaque liquid, but mucosa seen well). The                         total BBPS score equals 6. Fair Prep. The ileocecal                         valve, appendiceal orifice, and rectum were                         photographed. Findings:      The perianal and digital rectal examinations were normal. Pertinent       negatives include normal sphincter tone.      Scattered small-mouthed diverticula were found in the sigmoid colon.       Estimated blood loss: none.      Non-bleeding internal hemorrhoids were  found during retroflexion. The       hemorrhoids were Grade I (internal hemorrhoids that do not prolapse).       Estimated blood loss: none.      Three sessile polyps were found in the rectum (2) and descending colon.       The polyps were 1 to 2 mm in size. These polyps were removed with a       jumbo cold forceps. Resection and retrieval were complete. Estimated       blood loss was minimal.      A 7 to 8 mm polyp was found in the ascending colon. The polyp was       sessile. The polyp was removed with a hot snare. Resection and retrieval       were complete. Estimated blood loss was minimal.      The exam was otherwise without abnormality on direct and retroflexion       views. Impression:            - Diverticulosis in the sigmoid colon.                        - Non-bleeding internal hemorrhoids.                        - Three 1 to 2 mm polyps in the rectum and in the                         descending colon, removed with a jumbo cold forceps.                         Resected and retrieved.                        -  One 7 to 8 mm polyp in the ascending colon, removed                         with a hot snare. Resected and retrieved.                        - The examination was otherwise normal on direct and                         retroflexion views. Recommendation:        - Patient has a contact number available for                         emergencies. The signs and symptoms of potential                         delayed complications were discussed with the patient.                         Return to normal activities tomorrow. Written                         discharge instructions were provided to the patient.                        - Discharge patient to home.                        -  Resume previous diet.                        - Continue present medications.                        - No ibuprofen, naproxen, or other non-steroidal                         anti-inflammatory drugs for 5 days after polyp removal.                        - Await pathology results.                        - Repeat colonoscopy for surveillance based on                         pathology results.                        - Return to referring physician as previously                         scheduled.                        - The findings and recommendations were discussed with                         the patient. Procedure Code(s):     --- Professional ---                        [phone], Colonoscopy, flexible; with removal of                         tumor(s), polyp(s), or other lesion(s) by snare                         technique                        [id number], 59, Colonoscopy, flexible; with biopsy, single                         or multiple Diagnosis Code(s):     --- Professional ---                        Z86.010, Personal history of colonic polyps                        K64.0, First degree hemorrhoids                        D12.8, Benign neoplasm of rectum                        D12.4, Benign neoplasm of descending colon                         D12.2, Benign neoplasm of ascending colon  K57.30, Diverticulosis of large intestine without                         perforation or abscess without bleeding CPT copyright 2022 American Medical Association. All rights reserved. The codes documented in this report are preliminary and upon coder review may  be revised to meet current compliance requirements. Attending Participation:      I personally performed the entire procedure. Elspeth Jungling, DO Elspeth Ozell Jungling DO, DO 02/12/2024 8:10:43 AM This report has been signed electronically. Number of Addenda: 0 Note Initiated On: 02/12/2024 7:20 AM Scope Withdrawal Time: 0 hours 17 minutes 34 seconds  Total Procedure Duration: 0 hours 24 minutes 17 seconds  Estimated Blood Loss:  Estimated blood loss was minimal.      [facility]

## 2024-02-12 NOTE — H&P (Signed)
 Pre-Procedure H&P   Patient ID: Kelli Marks is a 63 y.o. female.  Gastroenterology Provider: Elspeth Ozell Jungling, DO  Referring Provider: Luke Barefoot, NP PCP: [facility]  Date: 02/12/2024  HPI Kelli Marks is a 63 y.o. female who presents today for Colonoscopy for Personal history of colon polyps, family history of colon cancer .  Underwent colonoscopy in March 2025 with poor prep.  Normal TI.  2 adenomatous polyps were removed  Status post hysterectomy and cholecystectomy.  Mother with colorectal cancer   Past Medical History:  Diagnosis Date   Anxiety    Arthritis    Asplenia    DDD (degenerative disc disease), lumbar    Depression    GERD (gastroesophageal reflux disease)    Hypercholesteremia    Hyperlipidemia    Insomnia    Overactive bladder    PONV (postoperative nausea and vomiting)    Rib fractures    Stroke [facility])    Wears dentures    full upper and lower    Past Surgical History:  Procedure Laterality Date   ABDOMINAL HYSTERECTOMY     ANKLE SURGERY Left    CHOLECYSTECTOMY     COLONOSCOPY     x4   COLONOSCOPY WITH PROPOFOL  N/A 08/28/2023   Procedure: COLONOSCOPY WITH PROPOFOL ;  Surgeon: Jungling Elspeth Ozell, DO;  Location: [facility] ENDOSCOPY;  Service: Gastroenterology;  Laterality: N/A;   HARDWARE REMOVAL Right 01/18/2017   Procedure: HARDWARE REMOVAL right tibia tibral plate and screws;  Surgeon: Edie Norleen PARAS, MD;  Location: [facility] SUR[facility]ERY [facility];  Service: Orthopedics;  Laterality: Right;  removed 5 screws and one plate from right ankle   hardware removal right distal tibia Right    ORIF ANKLE FRACTURE Right 09/23/2016   Procedure: OPEN REDUCTION INTERNAL FIXATION (ORIF) ANKLE FRACTURE;  Surgeon: Norleen PARAS Edie, MD;  Location: [facility] ORS;  Service: Orthopedics;  Laterality: Right;   POLYPECTOMY  08/28/2023   Procedure: POLYPECTOMY;  Surgeon: Jungling Elspeth Ozell, DO;  Location: [facility] ENDOSCOPY;  Service: Gastroenterology;;    SPLENECTOMY, TOTAL N/A 11/02/2014   Procedure: SPLENECTOMY;  Surgeon: Oneil Chang, MD;  Location: [facility] ORS;  Service: General;  Laterality: N/A;   TONSILLECTOMY      Family History Mother- crc No other h/o GI disease or malignancy  Review of Systems  Constitutional:  Negative for activity change, appetite change, chills, diaphoresis, fatigue, fever and unexpected weight change.  HENT:  Negative for trouble swallowing and voice change.   Respiratory:  Negative for shortness of breath and wheezing.   Cardiovascular:  Negative for chest pain, palpitations and leg swelling.  Gastrointestinal:  Negative for abdominal distention, abdominal pain, anal bleeding, blood in stool, constipation, diarrhea, nausea, rectal pain and vomiting.  Musculoskeletal:  Negative for arthralgias and myalgias.  Skin:  Negative for color change and pallor.  Neurological:  Negative for dizziness, syncope and weakness.  Psychiatric/Behavioral:  Negative for confusion.   All other systems reviewed and are negative.    Medications No current facility-administered medications on file prior to encounter.   Current Outpatient Medications on File Prior to Encounter  Medication Sig Dispense Refill   clonazePAM  (KLONOPIN ) 0.5 MG tablet TAKE 1/2 TO 1 TABLET BY MOUTH EVERY DAY ONLY AS NEEDED FOR ANXIETY  5   diclofenac  Sodium (VOLTAREN ) 1 % GEL Apply 2 g topically 4 (four) times daily. 100 g 0   ferrous sulfate  325 (65 FE) MG tablet Take by mouth.     fluticasone (FLONASE) 50  MCG/ACT nasal spray Place into both nostrils daily as needed for allergies or rhinitis.     oxybutynin  (DITROPAN ) 5 MG tablet Take 5 mg by mouth 2 (two) times daily.     ranitidine (ZANTAC) 150 MG tablet Take 150 mg by mouth daily.     sertraline (ZOLOFT) 25 MG tablet Take 25 mg by mouth daily.     simvastatin  (ZOCOR ) 10 MG tablet Take 10 mg by mouth at bedtime.      aspirin  EC 81 MG tablet Take 81 mg by mouth daily.     gabapentin (NEURONTIN) 300 MG  capsule TITRATE SLOWLY UP TO TAKING 1 CAPSULE BY MOUTH THREE TIMES A DAY     venlafaxine  XR (EFFEXOR -XR) 75 MG 24 hr capsule Take 225 mg by mouth daily with breakfast.       Pertinent medications related to GI and procedure were reviewed by me with the patient prior to the procedure   Current Facility-Administered Medications:    0.9 %  sodium chloride  infusion, , Intravenous, Continuous, Onita Elspeth Sharper, DO, Last Rate: 20 mL/hr at 02/12/24 0655, 20 mL/hr at 02/12/24 0655  sodium chloride  20 mL/hr (02/12/24 0655)       Allergies  Allergen Reactions   Augmentin [Amoxicillin-Pot Clavulanate] Nausea Only   Allergies were reviewed by me prior to the procedure  Objective   Body mass index is 22.24 kg/m. Vitals:   02/12/24 0645  BP: 115/72  Pulse: 86  Resp: 20  Temp: (!) 96.1 F (35.6 C)  TempSrc: Temporal  SpO2: 98%  Weight: 64.4 kg  Height: 5' 7 (1.702 m)     Physical Exam Vitals and nursing note reviewed.  Constitutional:      General: She is not in acute distress.    Appearance: Normal appearance. She is not ill-appearing, toxic-appearing or diaphoretic.  HENT:     Head: Normocephalic and atraumatic.     Nose: Nose normal.     Mouth/Throat:     Mouth: Mucous membranes are moist.     Pharynx: Oropharynx is clear.  Eyes:     General: No scleral icterus.    Extraocular Movements: Extraocular movements intact.  Cardiovascular:     Rate and Rhythm: Normal rate and regular rhythm.     Heart sounds: Normal heart sounds. No murmur heard.    No friction rub. No gallop.  Pulmonary:     Effort: Pulmonary effort is normal. No respiratory distress.     Breath sounds: Normal breath sounds. No wheezing, rhonchi or rales.  Abdominal:     General: Bowel sounds are normal. There is no distension.     Palpations: Abdomen is soft.     Tenderness: There is no abdominal tenderness. There is no guarding or rebound.  Musculoskeletal:     Cervical back: Neck supple.      Right lower leg: No edema.     Left lower leg: No edema.  Skin:    General: Skin is warm and dry.     Coloration: Skin is not jaundiced or pale.  Neurological:     General: No focal deficit present.     Mental Status: She is alert and oriented to person, place, and time. Mental status is at baseline.  Psychiatric:        Mood and Affect: Mood normal.        Behavior: Behavior normal.        Thought Content: Thought content normal.        Judgment: Judgment  normal.      Assessment:  Ms. MIZUKI HOEL is a 63 y.o. female  who presents today for Colonoscopy for Personal history of colon polyps, family history of colon cancer .  Plan:  Colonoscopy with possible intervention today  Colonoscopy with possible biopsy, control of bleeding, polypectomy, and interventions as necessary has been discussed with the patient/patient representative. Informed consent was obtained from the patient/patient representative after explaining the indication, nature, and risks of the procedure including but not limited to death, bleeding, perforation, missed neoplasm/lesions, cardiorespiratory compromise, and reaction to medications. Opportunity for questions was given and appropriate answers were provided. Patient/patient representative has verbalized understanding is amenable to undergoing the procedure.   Elspeth Ozell Jungling, DO  [facility]  Portions of the record may have been created with voice recognition software. Occasional wrong-word or 'sound-a-like' substitutions may have occurred due to the inherent limitations of voice recognition software.  Read the chart carefully and recognize, using context, where substitutions may have occurred.

## 2024-02-12 NOTE — Transfer of Care (Signed)
 Immediate Anesthesia Transfer of Care Note  Patient: Kelli Marks  Procedure(s) Performed: COLONOSCOPY POLYPECTOMY, INTESTINE  Patient Location: PACU  Anesthesia Type:General  Level of Consciousness: awake, alert , and oriented  Airway & Oxygen Therapy: Patient Spontanous Breathing  Post-op Assessment: Report given to RN and Post -op Vital signs reviewed and stable  Post vital signs: Reviewed and stable  Last Vitals:  Vitals Value Taken Time  BP 108/54 02/12/24 08:08  Temp 35.7 C 02/12/24 08:08  Pulse 77 02/12/24 08:12  Resp 22 02/12/24 08:12  SpO2 96 % 02/12/24 08:12  Vitals shown include unfiled device data.  Last Pain:  Vitals:   02/12/24 0808  TempSrc: Temporal  PainSc: Asleep         Complications: No notable events documented.

## 2024-02-12 NOTE — Anesthesia Preprocedure Evaluation (Addendum)
 Anesthesia Evaluation  Patient identified by MRN, date of birth, ID band Patient awake    Reviewed: Allergy & Precautions, H&P , NPO status , Patient's Chart, lab work & pertinent test results  History of Anesthesia Complications (+) PONV and history of anesthetic complications  Airway Mallampati: II  TM Distance: >3 FB Neck ROM: full    Dental  (+) Lower Dentures, Upper Dentures   Pulmonary Current Smoker   Pulmonary exam normal        Cardiovascular negative cardio ROS Normal cardiovascular exam     Neuro/Psych  PSYCHIATRIC DISORDERS Anxiety Depression     Neuromuscular disease CVA    GI/Hepatic negative GI ROS, Neg liver ROS,,,  Endo/Other  negative endocrine ROS    Renal/GU negative Renal ROS  negative genitourinary   Musculoskeletal   Abdominal Normal abdominal exam  (+)   Peds  Hematology negative hematology ROS (+)   Anesthesia Other Findings Past Medical History: No date: Anxiety No date: Arthritis No date: Asplenia No date: DDD (degenerative disc disease), lumbar No date: Depression No date: GERD (gastroesophageal reflux disease) No date: Hypercholesteremia No date: Hyperlipidemia No date: Insomnia No date: Overactive bladder No date: PONV (postoperative nausea and vomiting) No date: Rib fractures No date: Stroke [facility]) No date: Wears dentures     Comment:  full upper and lower  Past Surgical History: No date: ABDOMINAL HYSTERECTOMY No date: ANKLE SURGERY; Left No date: CHOLECYSTECTOMY No date: COLONOSCOPY     Comment:  x4 01/18/2017: HARDWARE REMOVAL; Right     Comment:  Procedure: HARDWARE REMOVAL right tibia tibral plate and              screws;  Surgeon: Edie Norleen PARAS, MD;  Location: [facility]               SUR[facility]ERY [facility];  Service: Orthopedics;  Laterality: Right;               removed 5 screws and one plate from right ankle No date: hardware removal right distal tibia; Right 09/23/2016:  ORIF ANKLE FRACTURE; Right     Comment:  Procedure: OPEN REDUCTION INTERNAL FIXATION (ORIF) ANKLE              FRACTURE;  Surgeon: Norleen PARAS Edie, MD;  Location: [facility]               ORS;  Service: Orthopedics;  Laterality: Right; 11/02/2014: SPLENECTOMY, TOTAL; N/A     Comment:  Procedure: SPLENECTOMY;  Surgeon: Oneil Chang, MD;                Location: [facility] ORS;  Service: General;  Laterality: N/A; No date: TONSILLECTOMY     Reproductive/Obstetrics negative OB ROS                              Anesthesia Physical Anesthesia Plan  ASA: 3  Anesthesia Plan: General   Post-op Pain Management:    Induction: Intravenous  PONV Risk Score and Plan: Propofol  infusion, TIVA and Ondansetron   Airway Management Planned: Natural Airway  Additional Equipment:   Intra-op Plan:   Post-operative Plan:   Informed Consent: I have reviewed the patients History and Physical, chart, labs and discussed the procedure including the risks, benefits and alternatives for the proposed anesthesia with the patient or authorized representative who has indicated his/her understanding and acceptance.     Dental Advisory Given  Plan Discussed with: CRNA and Surgeon  Anesthesia  Plan Comments:          Anesthesia Quick Evaluation

## 2024-02-12 NOTE — Interval H&P Note (Signed)
 History and Physical Interval Note: Preprocedure H&P from 02/12/24  was reviewed and there was no interval change after seeing and examining the patient.  Written consent was obtained from the patient after discussion of risks, benefits, and alternatives. Patient has consented to proceed with Colonoscopy with possible intervention   02/12/2024 7:30 AM  Kelli Marks  has presented today for surgery, with the diagnosis of Hx of adenomatous colonic polyps (Z86.0101) FH: colon cancer (Z80.0).  The various methods of treatment have been discussed with the patient and family. After consideration of risks, benefits and other options for treatment, the patient has consented to  Procedure(s): COLONOSCOPY (N/A) as a surgical intervention.  The patient's history has been reviewed, patient examined, no change in status, stable for surgery.  I have reviewed the patient's chart and labs.  Questions were answered to the patient's satisfaction.     Elspeth Ozell Jungling

## 2024-02-12 NOTE — Anesthesia Postprocedure Evaluation (Signed)
 Anesthesia Post Note  Patient: Kelli Marks  Procedure(s) Performed: COLONOSCOPY POLYPECTOMY, INTESTINE  Patient location during evaluation: Endoscopy Anesthesia Type: General Level of consciousness: awake and alert Pain management: pain level controlled Vital Signs Assessment: post-procedure vital signs reviewed and stable Respiratory status: spontaneous breathing, nonlabored ventilation, respiratory function stable and patient connected to nasal cannula oxygen Cardiovascular status: blood pressure returned to baseline and stable Postop Assessment: no apparent nausea or vomiting Anesthetic complications: no   No notable events documented.   Last Vitals:  Vitals:   02/12/24 0818 02/12/24 0828  BP: 122/78 130/75  Pulse: 73 70  Resp: 19 12  Temp:    SpO2: 99% 100%    Last Pain:  Vitals:   02/12/24 0828  TempSrc:   PainSc: 0-No pain                 [name]

## 2024-02-13 LAB — SURGICAL PATHOLOGY
